# Patient Record
Sex: Male | Born: 1940 | Race: White | Hispanic: No | Smoking: Current every day smoker
Health system: Southern US, Community
[De-identification: ages and names within clinical notes are randomized; demographics above are authoritative.]

## PROBLEM LIST (undated history)

## (undated) ENCOUNTER — Emergency Department: Payer: Medicare Other

## (undated) DIAGNOSIS — I480 Paroxysmal atrial fibrillation: Secondary | ICD-10-CM

## (undated) DIAGNOSIS — I10 Essential (primary) hypertension: Secondary | ICD-10-CM

## (undated) DIAGNOSIS — F1010 Alcohol abuse, uncomplicated: Secondary | ICD-10-CM

## (undated) DIAGNOSIS — I340 Nonrheumatic mitral (valve) insufficiency: Secondary | ICD-10-CM

## (undated) DIAGNOSIS — I5022 Chronic systolic (congestive) heart failure: Secondary | ICD-10-CM

## (undated) DIAGNOSIS — E46 Unspecified protein-calorie malnutrition: Secondary | ICD-10-CM

## (undated) DIAGNOSIS — I350 Nonrheumatic aortic (valve) stenosis: Secondary | ICD-10-CM

## (undated) DIAGNOSIS — R0602 Shortness of breath: Secondary | ICD-10-CM

## (undated) DIAGNOSIS — N179 Acute kidney failure, unspecified: Secondary | ICD-10-CM

## (undated) HISTORY — DX: Alcohol abuse, uncomplicated: F10.10

## (undated) HISTORY — DX: Unspecified protein-calorie malnutrition: E46

## (undated) HISTORY — DX: Acute kidney failure, unspecified: N17.9

## (undated) HISTORY — DX: Paroxysmal atrial fibrillation: I48.0

## (undated) HISTORY — DX: Nonrheumatic aortic (valve) stenosis: I35.0

## (undated) HISTORY — DX: Shortness of breath: R06.02

---

## 2004-05-10 ENCOUNTER — Emergency Department: Payer: Self-pay | Admitting: Emergency Medicine

## 2004-05-20 ENCOUNTER — Emergency Department: Payer: Self-pay | Admitting: Emergency Medicine

## 2005-10-31 ENCOUNTER — Emergency Department: Payer: Self-pay | Admitting: Unknown Physician Specialty

## 2005-11-01 ENCOUNTER — Emergency Department: Payer: Self-pay | Admitting: Emergency Medicine

## 2007-05-13 ENCOUNTER — Emergency Department: Payer: Self-pay | Admitting: Emergency Medicine

## 2007-09-22 ENCOUNTER — Emergency Department: Payer: Self-pay | Admitting: Emergency Medicine

## 2008-02-12 ENCOUNTER — Emergency Department: Payer: Self-pay | Admitting: Emergency Medicine

## 2008-04-21 ENCOUNTER — Emergency Department: Payer: Self-pay | Admitting: Emergency Medicine

## 2009-08-20 ENCOUNTER — Emergency Department: Payer: Self-pay | Admitting: Internal Medicine

## 2010-03-28 ENCOUNTER — Emergency Department: Payer: Self-pay | Admitting: Emergency Medicine

## 2012-07-29 ENCOUNTER — Emergency Department: Payer: Self-pay | Admitting: Emergency Medicine

## 2012-08-10 ENCOUNTER — Ambulatory Visit: Payer: Self-pay | Admitting: Orthopedic Surgery

## 2012-08-10 LAB — HEMOGLOBIN: HGB: 13.1 g/dL (ref 13.0–18.0)

## 2012-08-12 ENCOUNTER — Ambulatory Visit: Payer: Self-pay | Admitting: Orthopedic Surgery

## 2014-06-23 NOTE — Op Note (Signed)
PATIENT NAME:  Austin Zamora, Austin Zamora DATE OF BIRTH:  09-23-1940  DATE OF PROCEDURE:  08/12/2012  PREOPERATIVE DIAGNOSIS: Left distal radius fracture.   POSTOPERATIVE DIAGNOSIS: Left distal radius fracture.   PROCEDURE: Left distal radius fracture open reduction and internal fixation.   SURGEON: Leitha SchullerMichael J. Mendel Binsfeld, M.D.   ANESTHESIA: General.   DESCRIPTION OF PROCEDURE: The patient was brought to the operating room and after adequate anesthesia was obtained, the left arm was prepped and draped in the usual sterile fashion. After patient identification and timeout procedures were completed, tourniquet was raised to 250 mmHg. Incision was made over the FCR tendon and the tendon sheath incised. The tendon was retracted radially to help protect the radial artery and the deep fascia incised. The pronator was elevated off the distal radius and the fracture site exposed. Distal first approach was taken as there was really very little correction obtained with traction and flexion at the fracture site under mini fluoroscopy. After applying a short, narrow, DVR plate, the distal pegs were filled in sequential fashion, drilling, measuring and placing self-tapping screws with 1 variable angle screw. Following this, the plate was brought down to the volar aspect of the distal radius and with encouragement, 3 cortical screws were placed, getting the plate to an anatomic position and correcting the distal radius. AP and lateral imaging showed good correction of the deformity. At this point, the wound was irrigated and the tourniquet let down. Hemostasis was achieved with electrocautery. The wound was closed with small absorbable suture, 3-0 Vicryl, 4-0 nylon for the skin, Xeroform, 4 x 4's, Webril and Ace wrap with a volar splint. The patient was sent to the recovery room in stable condition.   ESTIMATED BLOOD LOSS: Minimal.   COMPLICATIONS: None.   SPECIMEN: None.   IMPLANTS: [employer]  short, narrow, DVR plate, screws and pegs.   TOURNIQUET TIME: 30 minutes at 250 mmHg.    ____________________________ Leitha SchullerMichael J. Croix Presley, MD mjm:gb D: 08/12/2012 22:20:06 ET T: 08/12/2012 23:08:30 ET JOB#: [id number]  cc: Leitha SchullerMichael J. Hayes Czaja, MD, <Dictator> Leitha SchullerMICHAEL J Shery [place] MD ELECTRONICALLY SIGNED 08/13/2012 8:18

## 2014-09-05 ENCOUNTER — Encounter: Payer: Self-pay | Admitting: Intensive Care

## 2014-09-05 ENCOUNTER — Observation Stay
Admission: EM | Admit: 2014-09-05 | Discharge: 2014-09-06 | Disposition: A | Discharge status: Home / Self Care | Payer: Medicare HMO | Attending: Internal Medicine | Admitting: Internal Medicine

## 2014-09-05 DIAGNOSIS — R55 Syncope and collapse: Secondary | ICD-10-CM | POA: Insufficient documentation

## 2014-09-05 DIAGNOSIS — E871 Hypo-osmolality and hyponatremia: Secondary | ICD-10-CM | POA: Insufficient documentation

## 2014-09-05 DIAGNOSIS — F17210 Nicotine dependence, cigarettes, uncomplicated: Secondary | ICD-10-CM | POA: Insufficient documentation

## 2014-09-05 DIAGNOSIS — F10120 Alcohol abuse with intoxication, uncomplicated: Secondary | ICD-10-CM | POA: Diagnosis present

## 2014-09-05 DIAGNOSIS — I350 Nonrheumatic aortic (valve) stenosis: Secondary | ICD-10-CM | POA: Diagnosis present

## 2014-09-05 DIAGNOSIS — F10920 Alcohol use, unspecified with intoxication, uncomplicated: Secondary | ICD-10-CM

## 2014-09-05 DIAGNOSIS — F10129 Alcohol abuse with intoxication, unspecified: Principal | ICD-10-CM | POA: Insufficient documentation

## 2014-09-05 DIAGNOSIS — E46 Unspecified protein-calorie malnutrition: Secondary | ICD-10-CM | POA: Diagnosis not present

## 2014-09-05 LAB — CBC
HCT: 40.1 % (ref 40.0–52.0)
HEMOGLOBIN: 13.4 g/dL (ref 13.0–18.0)
MCH: 31.7 pg (ref 26.0–34.0)
MCHC: 33.5 g/dL (ref 32.0–36.0)
MCV: 94.6 fL (ref 80.0–100.0)
Platelets: 270 10*3/uL (ref 150–440)
RBC: 4.24 MIL/uL — AB (ref 4.40–5.90)
RDW: 14 % (ref 11.5–14.5)
WBC: 8.3 10*3/uL (ref 3.8–10.6)
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
## 2014-09-05 NOTE — ED Notes (Signed)
Pt has sister at bedside. Pt daughter also called. Family wants pt to be IVC for etoh abuse

## 2014-09-06 DIAGNOSIS — I350 Nonrheumatic aortic (valve) stenosis: Secondary | ICD-10-CM

## 2014-09-06 LAB — COMPREHENSIVE METABOLIC PANEL
ALBUMIN: 4.3 g/dL (ref 3.5–5.0)
ALT: 19 U/L (ref 17–63)
ANION GAP: 11 (ref 5–15)
AST: 33 U/L (ref 15–41)
Alkaline Phosphatase: 78 U/L (ref 38–126)
BUN: 16 mg/dL (ref 6–20)
CALCIUM: 8.5 mg/dL — AB (ref 8.9–10.3)
CO2: 24 mmol/L (ref 22–32)
CREATININE: 1.11 mg/dL (ref 0.61–1.24)
Chloride: 94 mmol/L — ABNORMAL LOW (ref 101–111)
GFR calc Af Amer: >60 mL/min (ref >60)
Glucose, Bld: 96 mg/dL (ref 65–99)
Potassium: 3.9 mmol/L (ref 3.5–5.1)
Sodium: 129 mmol/L — ABNORMAL LOW (ref 135–145)
Total Bilirubin: 0.6 mg/dL (ref 0.3–1.2)
Total Protein: 7 g/dL (ref 6.5–8.1)

## 2014-09-06 LAB — TROPONIN I: Troponin I: <0.03 ng/mL (ref <0.031)

## 2014-09-06 LAB — ETHANOL: ALCOHOL ETHYL (B): 314 mg/dL — AB (ref <5)

## 2014-09-06 LAB — HEMOGLOBIN A1C: Hgb A1c MFr Bld: 5.5 % (ref 4.0–6.0)

## 2014-09-06 LAB — SALICYLATE LEVEL: Salicylate Lvl: <4 mg/dL (ref 2.8–30.0)

## 2014-09-06 LAB — CK: Total CK: 253 U/L (ref 49–397)

## 2014-09-06 LAB — ACETAMINOPHEN LEVEL

## 2014-09-06 LAB — TSH: TSH: 0.699 u[IU]/mL (ref 0.350–4.500)

## 2014-09-06 MED ORDER — SODIUM CHLORIDE 0.9 % IV SOLN
INTRAVENOUS | Status: DC
  Administered 2014-09-06: 05:00:00 via INTRAVENOUS

## 2014-09-06 MED ORDER — ONDANSETRON HCL 4 MG PO TABS: 4.0000 mg | ORAL_TABLET | Freq: Four times a day (QID) | ORAL | Status: DC | PRN

## 2014-09-06 MED ORDER — LORAZEPAM 2 MG/ML IJ SOLN: 2.0000 mg | INTRAMUSCULAR | Status: DC | PRN

## 2014-09-06 MED ORDER — LORAZEPAM 2 MG/ML IJ SOLN
INTRAMUSCULAR | Status: AC
  Filled 2014-09-06: qty 1

## 2014-09-06 MED ORDER — HEPARIN SODIUM (PORCINE) 5000 UNIT/ML IJ SOLN
5000.0000 [IU] | Freq: Three times a day (TID) | INTRAMUSCULAR | Status: DC
  Administered 2014-09-06: 5000 [IU] via SUBCUTANEOUS
  Filled 2014-09-06: qty 1

## 2014-09-06 MED ORDER — ONDANSETRON HCL 4 MG/2ML IJ SOLN: 4.0000 mg | Freq: Four times a day (QID) | INTRAMUSCULAR | Status: DC | PRN

## 2014-09-06 MED ORDER — LORAZEPAM 2 MG/ML IJ SOLN
INTRAMUSCULAR | Status: AC
Start: 2014-09-06 — End: 2014-09-06
  Administered 2014-09-06: 05:00:00 via INTRAVENOUS
  Filled 2014-09-06: qty 1

## 2014-09-06 MED ORDER — SODIUM CHLORIDE 0.9 % IJ SOLN: 3.0000 mL | Freq: Two times a day (BID) | INTRAMUSCULAR | Status: DC

## 2014-09-06 MED ORDER — ACETAMINOPHEN 650 MG RE SUPP: 650.0000 mg | Freq: Four times a day (QID) | RECTAL | Status: DC | PRN

## 2014-09-06 MED ORDER — DOCUSATE SODIUM 100 MG PO CAPS
100.0000 mg | ORAL_CAPSULE | Freq: Two times a day (BID) | ORAL | Status: DC
  Administered 2014-09-06: 100 mg via ORAL
  Filled 2014-09-06: qty 1

## 2014-09-06 MED ORDER — ACETAMINOPHEN 325 MG PO TABS: 650.0000 mg | ORAL_TABLET | Freq: Four times a day (QID) | ORAL | Status: DC | PRN

## 2014-09-06 NOTE — Progress Notes (Signed)
[facility] [place] at [facility]                                                                                                                                                                                            Patient Demographics   Austin Zamora, is a 74 y.o. male, DOB - October 21, 1940, ZOX:[id number]  Admit date - 09/05/2014   Admitting Physician Arnaldo Natal, MD  Outpatient Primary MD for the patient is No primary care provider on file.   LOS -   Subjective: Patient admitted being found down, he was intoxicated. He is back to his normal baseline states that he drank a little bit more than he should've. He wants to go home     Review of Systems:   CONSTITUTIONAL: No documented fever. No fatigue, weakness. No weight gain, no weight loss.  EYES: No blurry or double vision.  ENT: No tinnitus. No postnasal drip. No redness of the oropharynx.  RESPIRATORY: No cough, no wheeze, no hemoptysis. No dyspnea.  CARDIOVASCULAR: No chest pain. No orthopnea. No palpitations. No syncope.  GASTROINTESTINAL: No nausea, no vomiting or diarrhea. No abdominal pain. No melena or hematochezia.  GENITOURINARY: No dysuria or hematuria.  ENDOCRINE: No polyuria or nocturia. No heat or cold intolerance.  HEMATOLOGY: No anemia. No bruising. No bleeding.  INTEGUMENTARY: No rashes. No lesions.  MUSCULOSKELETAL: No arthritis. No swelling. No gout.  NEUROLOGIC: No numbness, tingling, or ataxia. No seizure-type activity.  PSYCHIATRIC: No anxiety. No insomnia. No ADD.    Vitals:   Filed Vitals:   09/06/14 0300 09/06/14 0432 09/06/14 0434 09/06/14 0748  BP: 132/72 144/80  163/77  Pulse: 62 67  57  Temp:  98.2 F (36.8 C)  98.3 F (36.8 C)  TempSrc:  Oral  Oral  Resp:  18  17  Height:      Weight:   74.254 kg (163 lb 11.2 oz)   SpO2: 94% 99%  97%    Wt Readings from Last 3 Encounters:  09/06/14 74.254 kg (163 lb 11.2 oz)     Intake/Output Summary (Last 24 hours)  at 09/06/14 1122 Last data filed at 09/06/14 0915  Gross per 24 hour  Intake    780 ml  Output    400 ml  Net    380 ml    Physical Exam:   GENERAL: Pleasant-appearing in no apparent distress.  HEAD, EYES, EARS, NOSE AND THROAT: Atraumatic, normocephalic. Extraocular muscles are intact. Pupils equal and reactive to light. Sclerae anicteric. No conjunctival injection. No oro-pharyngeal erythema.  NECK: Supple. There is no jugular venous distention. No bruits,  no lymphadenopathy, no thyromegaly.  HEART: Regular rate and rhythm, tachycardic. No murmurs, no rubs, no clicks.  LUNGS: Clear to auscultation bilaterally. No rales or rhonchi. No wheezes.  ABDOMEN: Soft, flat, nontender, nondistended. Has good bowel sounds. No hepatosplenomegaly appreciated.  EXTREMITIES: No evidence of any cyanosis, clubbing, or peripheral edema.  +2 pedal and radial pulses bilaterally.  NEUROLOGIC: The patient is alert, awake, and oriented x3 with no focal motor or sensory deficits appreciated bilaterally.  SKIN: Moist and warm with no rashes appreciated.  Psych: Not anxious, depressed LN: No inguinal LN enlargement    Antibiotics   Anti-infectives    None      Medications   Scheduled Meds: . docusate sodium  100 mg Oral BID  . heparin  5,000 Units Subcutaneous 3 times per day  . sodium chloride  3 mL Intravenous Q12H   Continuous Infusions: . sodium chloride 100 mL/hr at 09/06/14 0500   PRN Meds:.acetaminophen **OR** acetaminophen, LORazepam, ondansetron **OR** ondansetron (ZOFRAN) IV   Data Review:   Micro Results No results found for this or any previous visit (from the past 240 hour(s)).  Radiology Reports No results found.   CBC  Recent Labs Lab 09/05/14 2307  WBC 8.3  HGB 13.4  HCT 40.1  PLT 270  MCV 94.6  MCH 31.7  MCHC 33.5  RDW 14.0    Chemistries   Recent Labs Lab 09/05/14 2307  NA 129*  K 3.9  CL 94*  CO2 24  GLUCOSE 96  BUN 16  CREATININE 1.11  CALCIUM  8.5*  AST 33  ALT 19  ALKPHOS 78  BILITOT 0.6   ------------------------------------------------------------------------------------------------------------------ estimated creatinine clearance is 63.2 mL/min (by C-G formula based on Cr of 1.11). ------------------------------------------------------------------------------------------------------------------ No results for input(s): HGBA1C in the last 72 hours. ------------------------------------------------------------------------------------------------------------------ No results for input(s): CHOL, HDL, LDLCALC, TRIG, CHOLHDL, LDLDIRECT in the last 72 hours. ------------------------------------------------------------------------------------------------------------------  Recent Labs  09/06/14 0050  TSH 0.699   ------------------------------------------------------------------------------------------------------------------ No results for input(s): VITAMINB12, FOLATE, FERRITIN, TIBC, IRON, RETICCTPCT in the last 72 hours.  Coagulation profile No results for input(s): INR, PROTIME in the last 168 hours.  No results for input(s): DDIMER in the last 72 hours.  Cardiac Enzymes  Recent Labs Lab 09/06/14 0050  TROPONINI <0.03   ------------------------------------------------------------------------------------------------------------------ Invalid input(s): POCBNP    Assessment & Plan  This is a 74 year old Caucasian male admitted for alcohol intoxication and aortic stenosis. 1. Alcohol intoxication: Now resolved patient recommended not to drink too much alcohol. He is not interested in any detox 2. Aortic stenosis: Severity is unclear.  3. Malnutrition: The patient is mildly underweight likely due to caloric intake mostly alcohol recommended that he intake appropriate calories 4. Hyponatremia: Secondary to chronic alcohol abuse. Again patient is to stop drinking this will cause it to sodium to improve 5. DVT  prophylaxis: Heparin 6. GI prophylaxis: None The patient is a full code. Time spent on admission orders and patient care approximately 35 minutes          Code Status Orders        Start     Ordered   09/06/14 0430  Full code   Continuous     09/06/14 0429           Consults  none   DVT Prophylaxis ambulatory  Lab Results  Component Value Date   PLT 270 09/05/2014     Time Spent in minutes  45 minutes   Greater than 50% of time spent in  care coordination and counseling.   Auburn Bilberry M.D on 09/06/2014 at 11:22 AM  Between 7am to 6pm - Pager - [phone]  After 6pm go to [email or web address] - password [email or web address] [facility]  [facility]   Office  [phone]

## 2014-09-06 NOTE — ED Notes (Signed)
Patient transported to X-ray 

## 2014-09-06 NOTE — ED Notes (Signed)
Patient denies pain and is resting comfortably.  

## 2014-09-06 NOTE — Discharge Summary (Signed)
Austin Zamora, 74 y.o., DOB [birth year], MRN [id number]. Admission date: 09/05/2014 Discharge Date 09/06/2014 Primary MD No primary care provider on file. 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 Patient was seen in the ER and was noted to be intoxicated and was admitted for further evaluation and IV fluids. Patient this morning is doing well and wants to go home. Denies any chest pain or shortness of breath           Consults  None  Significant Tests:  See full reports for all details    No results found.     Today   Subjective:   Austin Zamora  feels well and is awake once to go home  Objective:   Blood pressure 163/77, pulse 57, temperature 98.3 F (36.8 C), temperature source Oral, resp. rate 17, height  (1.854 m), weight 74.254 kg (163 lb 11.2 oz), SpO2 97 %.  .  Intake/Output Summary (Last 24 hours) at 09/06/14 1146 Last data filed at 09/06/14 0915  Gross per 24 hour  Intake    780 ml  Output    400 ml  Net    380 ml    Exam VITAL SIGNS: Blood pressure 163/77, pulse 57, temperature 98.3 F (36.8 C), temperature source Oral, resp. rate 17, height  (1.854 m), weight 74.254 kg (163 lb 11.2 oz), SpO2 97 %.  GENERAL:  74 y.o.-year-old patient lying in the bed with no acute distress.  EYES: Pupils equal, round, reactive to light and accommodation. No scleral icterus. Extraocular muscles intact.  HEENT: Head atraumatic, normocephalic. Oropharynx and nasopharynx clear.  NECK:  Supple, no jugular venous distention. No  thyroid enlargement, no tenderness.  LUNGS: Normal breath sounds bilaterally, no wheezing, rales,rhonchi or crepitation. No use of accessory muscles of respiration.  CARDIOVASCULAR: S1, S2 normal. No murmurs, rubs, or gallops.  ABDOMEN: Soft, nontender, nondistended. Bowel sounds present. No organomegaly or mass.  EXTREMITIES: No pedal edema, cyanosis, or clubbing.  NEUROLOGIC: Cranial nerves II through XII are intact. Muscle strength 5/5 in all extremities. Sensation intact. Gait not checked.  PSYCHIATRIC: The patient is alert and oriented x 3.  SKIN: No obvious rash, lesion, or ulcer.   Data Review     CBC w Diff: Lab Results  Component Value Date   WBC 8.3 09/05/2014   HGB 13.4 09/05/2014   HGB 13.1 08/10/2012   HCT 40.1 09/05/2014   PLT 270 09/05/2014   CMP: Lab Results  Component Value Date   NA 129* 09/05/2014   K 3.9 09/05/2014   CL 94* 09/05/2014   CO2 24 09/05/2014   BUN 16 09/05/2014   CREATININE 1.11 09/05/2014   PROT 7.0 09/05/2014   ALBUMIN 4.3 09/05/2014   BILITOT 0.6 09/05/2014   ALKPHOS 78 09/05/2014   AST 33 09/05/2014   ALT 19 09/05/2014  .  Micro Results No results found for this or any previous visit (from the past 240 hour(s)).      Code Status Orders        Start     Ordered   09/06/14 0430  Full code   Continuous     09/06/14 0429          Follow-up Information    Follow up with Dr. Lorra HalsKelsay Walch On 10/03/2015.   Why:  Please bring proof of insurance and residence appointment with Dr. Robyne AskewWalch on 10/03/14 at 1:20pm.   Contact information:   [facility]                   [address]              [place] [id number]      Discharge Medications Patient not on any medications no medications prescribed    Medication List    Notice    You have not been prescribed any medications.         Total Time in preparing paper work, data evaluation and todays exam - 35 minutes  Auburn BilberryPATEL, Kairen Hallinan M.D on  09/06/2014 at 11:46 AM  [facility]   Office  [phone]

## 2014-09-06 NOTE — Discharge Instructions (Signed)
°  DIET:  Cardiac diet  DISCHARGE CONDITION:  Good  ACTIVITY:  Activity as tolerated  OXYGEN:  Home Oxygen: No.   Oxygen Delivery: room air  DISCHARGE LOCATION:  home    ADDITIONAL DISCHARGE INSTRUCTION: decrease amount of alcohol intake if you are gonna drink, resume all meds as taking before   If you experience worsening of your admission symptoms, develop shortness of breath, life threatening emergency, suicidal or homicidal thoughts you must seek medical attention immediately by calling 911 or calling your MD immediately  if symptoms less severe.  You Must read complete instructions/literature along with all the possible adverse reactions/side effects for all the Medicines you take and that have been prescribed to you. Take any new Medicines after you have completely understood and accpet all the possible adverse reactions/side effects.   Please note  You were cared for by a hospitalist during your hospital stay. If you have any questions about your discharge medications or the care you received while you were in the hospital after you are discharged, you can call the unit and asked to speak with the hospitalist on call if the hospitalist that took care of you is not available. Once you are discharged, your primary care physician will handle any further medical issues. Please note that NO REFILLS for any discharge medications will be authorized once you are discharged, as it is imperative that you return to your primary care physician (or establish a relationship with a primary care physician if you do not have one) for your aftercare needs so that they can reassess your need for medications and monitor your lab values.

## 2014-09-06 NOTE — H&P (Signed)
Austin Zamora is an 74 y.o. male.    Chief Complaint: Found down HPI: The patient presents to the emergency department via EMS after being found unconscious in his garage of loss of bowel and bladder continence. When he was found he required no resuscitation and would arouse to strong verbal stimuli. He was clearly intoxicated. In the emergency department he was found to have an elevated blood alcohol level as well as a significant murmur concerning for aortic stenosis. Due to the vast etiology and concerning potential for his loss of consciousness the emergency department called for admission. The patient's family was originally at the bedside however they were gone by the time of my exam  History reviewed. No pertinent past medical history.  History reviewed. No pertinent past surgical history.  History reviewed. No pertinent family history. Social History:  reports that he has been smoking Cigarettes.  He has been smoking about 1.00 pack per day. He has never used smokeless tobacco. He reports that he drinks alcohol. His drug history is not on file.  Allergies: No Known Allergies  No prescriptions prior to admission    Results for orders placed or performed during the hospital encounter of 09/05/14 (from the past 48 hour(s))  CBC     Status: Abnormal   Collection Time: 09/05/14 11:07 PM  Result Value Ref Range   WBC 8.3 3.8 - 10.6 K/uL   RBC 4.24 (L) 4.40 - 5.90 MIL/uL   Hemoglobin 13.4 13.0 - 18.0 g/dL   HCT 40.1 40.0 - 52.0 %   MCV 94.6 80.0 - 100.0 fL   MCH 31.7 26.0 - 34.0 pg   MCHC 33.5 32.0 - 36.0 g/dL   RDW 14.0 11.5 - 14.5 %   Platelets 270 150 - 440 K/uL  Comprehensive metabolic panel     Status: Abnormal   Collection Time: 09/05/14 11:07 PM  Result Value Ref Range   Sodium 129 (L) 135 - 145 mmol/L   Potassium 3.9 3.5 - 5.1 mmol/L   Chloride 94 (L) 101 - 111 mmol/L   CO2 24 22 - 32 mmol/L   Glucose, Bld 96 65 - 99 mg/dL   BUN 16 6 - 20 mg/dL   Creatinine, Ser 1.11  0.61 - 1.24 mg/dL   Calcium 8.5 (L) 8.9 - 10.3 mg/dL   Total Protein 7.0 6.5 - 8.1 g/dL   Albumin 4.3 3.5 - 5.0 g/dL   AST 33 15 - 41 U/L   ALT 19 17 - 63 U/L   Alkaline Phosphatase 78 38 - 126 U/L   Total Bilirubin 0.6 0.3 - 1.2 mg/dL   GFR calc non Af Amer >60 >60 mL/min   GFR calc Af Amer >60 >60 mL/min    Comment: (NOTE) The eGFR has been calculated using the CKD EPI equation. This calculation has not been validated in all clinical situations. eGFR's persistently <60 mL/min signify possible Chronic Kidney Disease.    Anion gap 11 5 - 15  Ethanol (ETOH)     Status: Abnormal   Collection Time: 09/05/14 11:07 PM  Result Value Ref Range   Alcohol, Ethyl (B) 314 (HH) <5 mg/dL    Comment: CRITICAL RESULT CALLED TO, READ BACK BY AND VERIFIED WITH KIMREY BROWN @ 0008 7.6.16 MPG        LOWEST DETECTABLE LIMIT FOR SERUM ALCOHOL IS 5 mg/dL FOR MEDICAL PURPOSES ONLY   Acetaminophen level     Status: Abnormal   Collection Time: 09/05/14 11:07 PM  Result Value  Ref Range   Acetaminophen (Tylenol), Serum <10 (L) 10 - 30 ug/mL    Comment:        THERAPEUTIC CONCENTRATIONS VARY SIGNIFICANTLY. A RANGE OF 10-30 ug/mL MAY BE AN EFFECTIVE CONCENTRATION FOR MANY PATIENTS. HOWEVER, SOME ARE BEST TREATED AT CONCENTRATIONS OUTSIDE THIS RANGE. ACETAMINOPHEN CONCENTRATIONS >150 ug/mL AT 4 HOURS AFTER INGESTION AND >50 ug/mL AT 12 HOURS AFTER INGESTION ARE OFTEN ASSOCIATED WITH TOXIC REACTIONS.   Salicylate level     Status: None   Collection Time: 09/05/14 11:07 PM  Result Value Ref Range   Salicylate Lvl <8.7 2.8 - 30.0 mg/dL  CK     Status: None   Collection Time: 09/05/14 11:07 PM  Result Value Ref Range   Total CK 253 49 - 397 U/L  Troponin I     Status: None   Collection Time: 09/06/14 12:50 AM  Result Value Ref Range   Troponin I <0.03 <0.031 ng/mL    Comment:        NO INDICATION OF MYOCARDIAL INJURY.    No results found.  Review of Systems  Unable to perform ROS:  patient intoxicated  Cardiovascular: Positive for chest pain.   the patient arouses long enough to coming that he is in no pain. Otherwise he does not persistently respond appropriately to questions  Blood pressure 144/80, pulse 67, temperature 98.2 F (36.8 C), temperature source Oral, resp. rate 18, height $RemoveBe'6\' 1"'iJnhMuZfw$  (1.854 m), weight 74.254 kg (163 lb 11.2 oz), SpO2 99 %. Physical Exam  Constitutional: He appears well-developed and well-nourished. He appears lethargic. No distress.  HENT:  Head: Normocephalic and atraumatic.  Mouth/Throat: Oropharynx is clear and moist.  Eyes: Conjunctivae and EOM are normal. Pupils are equal, round, and reactive to light. No scleral icterus.  Neck: Normal range of motion. Neck supple. No JVD present. No tracheal deviation present. No thyromegaly present.  Cardiovascular: Normal rate, regular rhythm and intact distal pulses.  Exam reveals no gallop and no friction rub.   Murmur heard.  Systolic murmur is present with a grade of 3/6  Respiratory: Effort normal and breath sounds normal. No respiratory distress.  GI: Soft. Bowel sounds are normal. He exhibits no distension. There is no tenderness.  Genitourinary:  Deferred  Musculoskeletal: He exhibits no edema.  Patient is uncooperative  Lymphadenopathy:    He has no cervical adenopathy.  Neurological: He appears lethargic.  Patient is uncooperative with neurological exam  Skin: Skin is warm and dry. No rash noted. No erythema.  Psychiatric:  Unable to assess as the patient is not a reliable historian nor focused on interviewer      Assessment/Plan This is a 74 year old Caucasian male admitted for alcohol intoxication and aortic stenosis. 1. Alcohol intoxication: Aggressively hydrate the patient. We have placed him on CIWA scale to screen for withdrawal.  2. Aortic stenosis: Severity is unclear. The patient is maintained his pressures throughout this ordeal. Cardiology consultation ordered for echo  evaluation. 3. Malnutrition: The patient is mildly underweight likely due to caloric intake mostly alcohol  4. Hyponatremia: Secondary to chronic alcohol abuse. Rehydrate the patient gradually monitor sodium levels 5. DVT prophylaxis: Heparin 6. GI prophylaxis: None The patient is a full code. Time spent on admission orders and patient care approximately 35 minutes  Harrie Foreman 09/06/2014, 7:10 AM

## 2014-09-06 NOTE — Progress Notes (Signed)
Pt d/c home; d/c instructions reviewed w/ pt; pt understanding was verbalized; IV removed catheter in tact, gauze dressing applied; all pt questions answered; pt left unit via wheelchair accompanied by staff 

## 2014-10-16 DIAGNOSIS — F1010 Alcohol abuse, uncomplicated: Secondary | ICD-10-CM | POA: Insufficient documentation

## 2017-04-06 DIAGNOSIS — R0602 Shortness of breath: Secondary | ICD-10-CM | POA: Insufficient documentation

## 2017-04-06 DIAGNOSIS — I480 Paroxysmal atrial fibrillation: Secondary | ICD-10-CM | POA: Insufficient documentation

## 2017-05-17 ENCOUNTER — Inpatient Hospital Stay
Admission: EM | Admit: 2017-05-17 | Discharge: 2017-05-18 | DRG: 193 | Disposition: A | Discharge status: Home Health | Payer: Medicare Other | Attending: Internal Medicine | Admitting: Internal Medicine

## 2017-05-17 ENCOUNTER — Inpatient Hospital Stay: Payer: Medicare Other

## 2017-05-17 ENCOUNTER — Inpatient Hospital Stay
Admit: 2017-05-17 | Discharge: 2017-05-17 | Disposition: A | Discharge status: Home / Self Care | Payer: Medicare Other | Attending: Family Medicine | Admitting: Family Medicine

## 2017-05-17 ENCOUNTER — Emergency Department: Payer: Medicare Other

## 2017-05-17 ENCOUNTER — Encounter: Payer: Self-pay | Admitting: Emergency Medicine

## 2017-05-17 ENCOUNTER — Other Ambulatory Visit: Payer: Self-pay

## 2017-05-17 DIAGNOSIS — N179 Acute kidney failure, unspecified: Secondary | ICD-10-CM | POA: Diagnosis not present

## 2017-05-17 DIAGNOSIS — Z7901 Long term (current) use of anticoagulants: Secondary | ICD-10-CM

## 2017-05-17 DIAGNOSIS — F1010 Alcohol abuse, uncomplicated: Secondary | ICD-10-CM | POA: Diagnosis present

## 2017-05-17 DIAGNOSIS — I712 Thoracic aortic aneurysm, without rupture: Secondary | ICD-10-CM | POA: Diagnosis not present

## 2017-05-17 DIAGNOSIS — I5020 Unspecified systolic (congestive) heart failure: Secondary | ICD-10-CM | POA: Diagnosis present

## 2017-05-17 DIAGNOSIS — E43 Unspecified severe protein-calorie malnutrition: Secondary | ICD-10-CM

## 2017-05-17 DIAGNOSIS — I080 Rheumatic disorders of both mitral and aortic valves: Secondary | ICD-10-CM | POA: Diagnosis not present

## 2017-05-17 DIAGNOSIS — E872 Acidosis, unspecified: Secondary | ICD-10-CM

## 2017-05-17 DIAGNOSIS — Z9889 Other specified postprocedural states: Secondary | ICD-10-CM

## 2017-05-17 DIAGNOSIS — F17210 Nicotine dependence, cigarettes, uncomplicated: Secondary | ICD-10-CM | POA: Diagnosis not present

## 2017-05-17 DIAGNOSIS — Z6821 Body mass index (BMI) 21.0-21.9, adult: Secondary | ICD-10-CM | POA: Diagnosis not present

## 2017-05-17 DIAGNOSIS — Z8249 Family history of ischemic heart disease and other diseases of the circulatory system: Secondary | ICD-10-CM

## 2017-05-17 DIAGNOSIS — R64 Cachexia: Secondary | ICD-10-CM | POA: Diagnosis present

## 2017-05-17 DIAGNOSIS — J449 Chronic obstructive pulmonary disease, unspecified: Secondary | ICD-10-CM | POA: Diagnosis present

## 2017-05-17 DIAGNOSIS — I110 Hypertensive heart disease with heart failure: Secondary | ICD-10-CM | POA: Diagnosis present

## 2017-05-17 DIAGNOSIS — J101 Influenza due to other identified influenza virus with other respiratory manifestations: Principal | ICD-10-CM | POA: Diagnosis present

## 2017-05-17 DIAGNOSIS — I248 Other forms of acute ischemic heart disease: Secondary | ICD-10-CM | POA: Diagnosis not present

## 2017-05-17 DIAGNOSIS — E860 Dehydration: Secondary | ICD-10-CM

## 2017-05-17 DIAGNOSIS — R531 Weakness: Secondary | ICD-10-CM | POA: Diagnosis present

## 2017-05-17 DIAGNOSIS — I480 Paroxysmal atrial fibrillation: Secondary | ICD-10-CM | POA: Diagnosis not present

## 2017-05-17 DIAGNOSIS — Z79899 Other long term (current) drug therapy: Secondary | ICD-10-CM

## 2017-05-17 DIAGNOSIS — J9601 Acute respiratory failure with hypoxia: Secondary | ICD-10-CM | POA: Diagnosis present

## 2017-05-17 DIAGNOSIS — I350 Nonrheumatic aortic (valve) stenosis: Secondary | ICD-10-CM

## 2017-05-17 DIAGNOSIS — R0602 Shortness of breath: Secondary | ICD-10-CM

## 2017-05-17 HISTORY — DX: Unspecified severe protein-calorie malnutrition: E43

## 2017-05-17 LAB — LIPID PANEL
CHOLESTEROL: 104 mg/dL (ref 0–200)
HDL: 28 mg/dL — AB (ref >40)
LDL CALC: 60 mg/dL (ref 0–99)
Total CHOL/HDL Ratio: 3.7 RATIO
Triglycerides: 79 mg/dL (ref <150)
VLDL: 16 mg/dL (ref 0–40)

## 2017-05-17 LAB — COMPREHENSIVE METABOLIC PANEL
ALK PHOS: 98 U/L (ref 38–126)
ALT: 29 U/L (ref 17–63)
AST: 47 U/L — ABNORMAL HIGH (ref 15–41)
Albumin: 3.2 g/dL — ABNORMAL LOW (ref 3.5–5.0)
Anion gap: 11 (ref 5–15)
BILIRUBIN TOTAL: 1.8 mg/dL — AB (ref 0.3–1.2)
BUN: 45 mg/dL — ABNORMAL HIGH (ref 6–20)
CO2: 23 mmol/L (ref 22–32)
Calcium: 9.7 mg/dL (ref 8.9–10.3)
Chloride: 100 mmol/L — ABNORMAL LOW (ref 101–111)
Creatinine, Ser: 1.94 mg/dL — ABNORMAL HIGH (ref 0.61–1.24)
GFR calc Af Amer: 37 mL/min — ABNORMAL LOW (ref >60)
GFR calc non Af Amer: 32 mL/min — ABNORMAL LOW (ref >60)
Glucose, Bld: 97 mg/dL (ref 65–99)
Potassium: 4.1 mmol/L (ref 3.5–5.1)
Sodium: 134 mmol/L — ABNORMAL LOW (ref 135–145)
Total Protein: 7 g/dL (ref 6.5–8.1)

## 2017-05-17 LAB — URINALYSIS, COMPLETE (UACMP) WITH MICROSCOPIC
BACTERIA UA: NONE SEEN
BILIRUBIN URINE: NEGATIVE
Glucose, UA: NEGATIVE mg/dL
HGB URINE DIPSTICK: NEGATIVE
KETONES UR: NEGATIVE mg/dL
LEUKOCYTES UA: NEGATIVE
Nitrite: NEGATIVE
PROTEIN: NEGATIVE mg/dL
SPECIFIC GRAVITY, URINE: 1.013 (ref 1.005–1.030)
SQUAMOUS EPITHELIAL / LPF: NONE SEEN
pH: 5 (ref 5.0–8.0)

## 2017-05-17 LAB — CBC WITH DIFFERENTIAL/PLATELET
Basophils Absolute: 0.1 10*3/uL (ref 0–0.1)
Basophils Relative: 1 %
Eosinophils Absolute: 0.1 10*3/uL (ref 0–0.7)
Eosinophils Relative: 1 %
HCT: 52.3 % — ABNORMAL HIGH (ref 40.0–52.0)
HEMOGLOBIN: 16.7 g/dL (ref 13.0–18.0)
LYMPHS PCT: 24 %
Lymphs Abs: 1.7 10*3/uL (ref 1.0–3.6)
MCH: 27.2 pg (ref 26.0–34.0)
MCHC: 31.9 g/dL — AB (ref 32.0–36.0)
MCV: 85 fL (ref 80.0–100.0)
MONOS PCT: 12 %
Monocytes Absolute: 0.8 10*3/uL (ref 0.2–1.0)
NEUTROS ABS: 4.4 10*3/uL (ref 1.4–6.5)
NEUTROS PCT: 62 %
Platelets: 285 10*3/uL (ref 150–440)
RBC: 6.15 MIL/uL — ABNORMAL HIGH (ref 4.40–5.90)
RDW: 22.2 % — AB (ref 11.5–14.5)
WBC: 7 10*3/uL (ref 3.8–10.6)

## 2017-05-17 LAB — ECHOCARDIOGRAM COMPLETE
Height: 68 in
Weight: 2308.8 oz

## 2017-05-17 LAB — TROPONIN I
Troponin I: 0.07 ng/mL (ref <0.03)
Troponin I: 0.06 ng/mL (ref <0.03)
Troponin I: 0.04 ng/mL (ref <0.03)
Troponin I: 0.04 ng/mL (ref <0.03)

## 2017-05-17 LAB — INFLUENZA PANEL BY PCR (TYPE A & B)
INFLAPCR: POSITIVE — AB
Influenza B By PCR: NEGATIVE

## 2017-05-17 LAB — TSH: TSH: 1.663 u[IU]/mL (ref 0.350–4.500)

## 2017-05-17 LAB — LACTIC ACID, PLASMA
Lactic Acid, Venous: 2.1 mmol/L (ref 0.5–1.9)
Lactic Acid, Venous: 2 mmol/L (ref 0.5–1.9)

## 2017-05-17 LAB — PREALBUMIN: Prealbumin: 8.8 mg/dL — ABNORMAL LOW (ref 18–38)

## 2017-05-17 LAB — LIPASE, BLOOD: Lipase: 48 U/L (ref 11–51)

## 2017-05-17 LAB — BRAIN NATRIURETIC PEPTIDE

## 2017-05-17 MED ORDER — BUDESONIDE 0.5 MG/2ML IN SUSP
0.5000 mg | Freq: Two times a day (BID) | RESPIRATORY_TRACT | Status: DC
  Administered 2017-05-17 – 2017-05-18 (×3): 0.5 mg via RESPIRATORY_TRACT
  Filled 2017-05-17 (×3): qty 2

## 2017-05-17 MED ORDER — ENSURE ENLIVE PO LIQD: 237.0000 mL | Freq: Three times a day (TID) | ORAL | Status: DC

## 2017-05-17 MED ORDER — NITROGLYCERIN 0.4 MG SL SUBL: 0.4000 mg | SUBLINGUAL_TABLET | SUBLINGUAL | Status: DC | PRN

## 2017-05-17 MED ORDER — HEPARIN SODIUM (PORCINE) 5000 UNIT/ML IJ SOLN
5000.0000 [IU] | Freq: Three times a day (TID) | INTRAMUSCULAR | Status: DC
  Administered 2017-05-17: 5000 [IU] via SUBCUTANEOUS
  Filled 2017-05-17: qty 1

## 2017-05-17 MED ORDER — OSELTAMIVIR PHOSPHATE 75 MG PO CAPS
75.0000 mg | ORAL_CAPSULE | Freq: Once | ORAL | Status: AC
  Administered 2017-05-17: 75 mg via ORAL
  Filled 2017-05-17: qty 1

## 2017-05-17 MED ORDER — OSELTAMIVIR PHOSPHATE 30 MG PO CAPS
30.0000 mg | ORAL_CAPSULE | Freq: Two times a day (BID) | ORAL | Status: DC
  Administered 2017-05-17 – 2017-05-18 (×3): 30 mg via ORAL
  Filled 2017-05-17 (×4): qty 1

## 2017-05-17 MED ORDER — ACETAMINOPHEN 650 MG RE SUPP
650.0000 mg | Freq: Four times a day (QID) | RECTAL | Status: DC | PRN
Start: 2017-05-17 — End: 2017-05-18

## 2017-05-17 MED ORDER — ACETAMINOPHEN 325 MG PO TABS: 650.0000 mg | ORAL_TABLET | Freq: Four times a day (QID) | ORAL | Status: DC | PRN

## 2017-05-17 MED ORDER — LORAZEPAM 2 MG/ML IJ SOLN: 1.0000 mg | Freq: Four times a day (QID) | INTRAMUSCULAR | Status: DC | PRN

## 2017-05-17 MED ORDER — ONDANSETRON HCL 4 MG/2ML IJ SOLN: 4.0000 mg | Freq: Four times a day (QID) | INTRAMUSCULAR | Status: DC | PRN

## 2017-05-17 MED ORDER — ONDANSETRON HCL 4 MG PO TABS: 4.0000 mg | ORAL_TABLET | Freq: Four times a day (QID) | ORAL | Status: DC | PRN

## 2017-05-17 MED ORDER — FOLIC ACID 1 MG PO TABS
1.0000 mg | ORAL_TABLET | Freq: Every day | ORAL | Status: DC
  Administered 2017-05-17 – 2017-05-18 (×2): 1 mg via ORAL
  Filled 2017-05-17 (×2): qty 1

## 2017-05-17 MED ORDER — ALUM & MAG HYDROXIDE-SIMETH 200-200-20 MG/5ML PO SUSP
30.0000 mL | ORAL | Status: DC | PRN
  Administered 2017-05-17: 30 mL via ORAL
  Filled 2017-05-17: qty 30

## 2017-05-17 MED ORDER — MORPHINE SULFATE (PF) 2 MG/ML IV SOLN: 2.0000 mg | INTRAVENOUS | Status: DC | PRN

## 2017-05-17 MED ORDER — SODIUM CHLORIDE 0.9 % IV BOLUS (SEPSIS)
500.0000 mL | Freq: Once | INTRAVENOUS | Status: AC
  Administered 2017-05-17: 500 mL via INTRAVENOUS

## 2017-05-17 MED ORDER — VITAMIN B-1 100 MG PO TABS
100.0000 mg | ORAL_TABLET | Freq: Every day | ORAL | Status: DC
  Administered 2017-05-17 – 2017-05-18 (×2): 100 mg via ORAL
  Filled 2017-05-17 (×2): qty 1

## 2017-05-17 MED ORDER — IPRATROPIUM-ALBUTEROL 0.5-2.5 (3) MG/3ML IN SOLN: 3.0000 mL | RESPIRATORY_TRACT | Status: DC | PRN

## 2017-05-17 MED ORDER — SODIUM CHLORIDE 0.9 % IV SOLN
INTRAVENOUS | Status: DC
  Administered 2017-05-17: 07:00:00 via INTRAVENOUS

## 2017-05-17 MED ORDER — APIXABAN 5 MG PO TABS: 5.0000 mg | ORAL_TABLET | Freq: Two times a day (BID) | ORAL | Status: DC

## 2017-05-17 MED ORDER — HYDROCODONE-ACETAMINOPHEN 5-325 MG PO TABS
1.0000 | ORAL_TABLET | ORAL | Status: DC | PRN
  Administered 2017-05-17 (×2): 2 via ORAL
  Filled 2017-05-17 (×2): qty 2

## 2017-05-17 MED ORDER — LISINOPRIL 20 MG PO TABS: 20.0000 mg | ORAL_TABLET | Freq: Every day | ORAL | Status: DC

## 2017-05-17 MED ORDER — ADULT MULTIVITAMIN W/MINERALS CH
1.0000 | ORAL_TABLET | Freq: Every day | ORAL | Status: DC
  Administered 2017-05-17 – 2017-05-18 (×2): 1 via ORAL
  Filled 2017-05-17 (×2): qty 1

## 2017-05-17 MED ORDER — NICOTINE 14 MG/24HR TD PT24
14.0000 mg | MEDICATED_PATCH | Freq: Every day | TRANSDERMAL | Status: DC
  Administered 2017-05-17 – 2017-05-18 (×2): 14 mg via TRANSDERMAL
  Filled 2017-05-17 (×2): qty 1

## 2017-05-17 MED ORDER — CARVEDILOL 6.25 MG PO TABS: 3.1250 mg | ORAL_TABLET | Freq: Two times a day (BID) | ORAL | Status: DC

## 2017-05-17 MED ORDER — GUAIFENESIN ER 600 MG PO TB12
600.0000 mg | ORAL_TABLET | Freq: Two times a day (BID) | ORAL | Status: DC
  Administered 2017-05-17 – 2017-05-18 (×2): 600 mg via ORAL
  Filled 2017-05-17 (×3): qty 1

## 2017-05-17 MED ORDER — ASPIRIN EC 325 MG PO TBEC
325.0000 mg | DELAYED_RELEASE_TABLET | Freq: Every day | ORAL | Status: DC
  Administered 2017-05-17 – 2017-05-18 (×2): 325 mg via ORAL
  Filled 2017-05-17 (×2): qty 1

## 2017-05-17 MED ORDER — APIXABAN 5 MG PO TABS
5.0000 mg | ORAL_TABLET | Freq: Two times a day (BID) | ORAL | Status: DC
  Administered 2017-05-17 – 2017-05-18 (×2): 5 mg via ORAL
  Filled 2017-05-17 (×2): qty 1

## 2017-05-17 MED ORDER — THIAMINE HCL 100 MG/ML IJ SOLN: 100.0000 mg | Freq: Every day | INTRAMUSCULAR | Status: DC

## 2017-05-17 MED ORDER — METOPROLOL SUCCINATE ER 50 MG PO TB24
50.0000 mg | ORAL_TABLET | Freq: Every day | ORAL | Status: DC
  Administered 2017-05-17: 50 mg via ORAL
  Filled 2017-05-17 (×2): qty 1

## 2017-05-17 MED ORDER — LORAZEPAM 1 MG PO TABS: 1.0000 mg | ORAL_TABLET | Freq: Four times a day (QID) | ORAL | Status: DC | PRN

## 2017-05-17 NOTE — ED Provider Notes (Signed)
[facility] Emergency Department Provider Note  ____________________________________________   First MD Initiated Contact with Patient 05/17/17 [phone]     (approximate)  I have reviewed the triage vital signs and the nursing notes.   HISTORY  Chief Complaint Generalized Body Aches  History is challenging to obtain as the patient is a very poor historian  HPI Austin Zamora is a 77 y.o. male is brought to the emergency department by family with generalized malaise slowly progressive for the past 2 weeks or so.  Family is concerned because they have had other family members with influenza and they feel the patient may have it as well.  He has had decreased exercise tolerance recently and has exertional chest pain and shortness of breath.  He is been having intermittent loose stools for the past several days.  His symptoms are currently moderate severity.  They are worse with exertion and somewhat improved with rest.  He has a complex past medical history including heart failure as well as alcohol abuse.  History reviewed. No pertinent past medical history.  Patient Active Problem List   Diagnosis Date Noted  . Influenza A 05/17/2017  . Protein-calorie malnutrition, severe 05/17/2017  . Aortic stenosis 09/06/2014    History reviewed. No pertinent surgical history.  Prior to Admission medications   Medication Sig Start Date End Date Taking? Authorizing Provider  ELIQUIS 5 MG TABS tablet Take 5 mg by mouth 2 (two) times daily.  04/06/17  Yes [provider]  furosemide (LASIX) 20 MG tablet Take 20 mg by mouth daily.  04/06/17  Yes [provider]  lisinopril (PRINIVIL,ZESTRIL) 20 MG tablet Take 20 mg by mouth daily.  05/15/17  Yes [provider]  metoprolol succinate (TOPROL-XL) 50 MG 24 hr tablet Take 50 mg by mouth daily.  04/06/17  Yes [provider]  PROAIR HFA 108 [phone] Base) MCG/ACT inhaler  05/15/17  Yes [provider]  feeding supplement, ENSURE ENLIVE, (ENSURE ENLIVE) LIQD Take 237 mLs by mouth 3 (three) times daily between meals. 05/18/17   Adrian Saran, MD  nicotine (NICODERM CQ - DOSED IN MG/24 HOURS) 14 mg/24hr patch Place 1 patch (14 mg total) onto the skin daily. 05/18/17   Adrian Saran, MD  nitroGLYCERIN (NITROSTAT) 0.4 MG SL tablet Place 1 tablet (0.4 mg total) under the tongue every 5 (five) minutes as needed for chest pain. 05/18/17   Adrian Saran, MD  oseltamivir (TAMIFLU) 30 MG capsule Take 1 capsule (30 mg total) by mouth 2 (two) times daily for 3 days. 05/18/17 05/21/17  Adrian Saran, MD    Allergies Patient has no known allergies.  No family history on file.  Social History Social History   Tobacco Use  . Smoking status: Current Every Day Smoker    Packs/day: 1.00    Types: Cigarettes  . Smokeless tobacco: Never Used  Substance Use Topics  . Alcohol use: Yes  . Drug use: No    Review of Systems Constitutional: No fever/chills Eyes: No visual changes. ENT: No sore throat. Cardiovascular: Positive for chest pain. Respiratory: Positive for shortness of breath. Gastrointestinal: No abdominal pain.  Positive for nausea, no vomiting.  Positive for diarrhea.  No constipation. Genitourinary: Negative for dysuria. Musculoskeletal: Negative for back pain. Skin: Negative for rash. Neurological: Negative for headaches, focal weakness or numbness.   ____________________________________________   PHYSICAL EXAM:  VITAL SIGNS: ED Triage Vitals  Enc Vitals Group     BP 05/17/17 0329 99/70  Pulse Rate 05/17/17 0329 72     Resp 05/17/17 0329 (!) 22     Temp 05/17/17 0329 (!) 97.5 F (36.4 C)     Temp Source 05/17/17 0329 Axillary     SpO2 05/17/17 0329 99 %     Weight 05/17/17 0334 145 lb (65.8 kg)     Height 05/17/17 0334 5\' 8"  (1.727 m)     Head Circumference --      Peak Flow --      Pain Score --      Pain Loc --      Pain Edu? --      Excl. in GC? --      Constitutional: Cachectic and chronically ill-appearing with elevated respiratory rate Eyes: PERRL EOMI. Head: Atraumatic. Nose: No congestion/rhinnorhea. Mouth/Throat: No trismus Neck: No stridor.  Positive for JVD but can lie flat  Cardiovascular: Normal rate, regular rhythm. Grossly normal heart sounds.  Good peripheral circulation. Respiratory: Increased respiratory effort.  No retractions.  Mild crackles throughout Gastrointestinal: Soft nontender Musculoskeletal: No lower extremity edema   Neurologic:  Normal speech and language. No gross focal neurologic deficits are appreciated. Skin:  Skin is warm, dry and intact. No rash noted. 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    ____________________________________________   DIFFERENTIAL includes but not limited to  Influenza, dehydration, acute coronary syndrome, urinary tract infection ____________________________________________   LABS (all labs ordered are listed, but only abnormal results are displayed)  Labs Reviewed  INFLUENZA PANEL BY PCR (TYPE A & B) - Abnormal; Notable for the following components:      Result Value   Influenza A By PCR POSITIVE (*)    All other components within normal limits  COMPREHENSIVE METABOLIC PANEL - Abnormal; Notable for the following components:   Sodium 134 (*)    Chloride 100 (*)    BUN 45 (*)    Creatinine, Ser 1.94 (*)    Albumin 3.2 (*)    AST 47 (*)    Total Bilirubin 1.8 (*)    GFR calc non Af Amer 32 (*)    GFR calc Af Amer 37 (*)    All other components within normal limits  LACTIC ACID, PLASMA - Abnormal; Notable for the following components:   Lactic Acid, Venous 2.1 (*)    All other components within normal limits  LACTIC ACID, PLASMA - Abnormal; Notable for the following components:   Lactic Acid, Venous 2.0 (*)    All other components within normal limits  CBC WITH DIFFERENTIAL/PLATELET - Abnormal; Notable for the following components:   RBC 6.15 (*)    HCT 52.3 (*)     MCHC 31.9 (*)    RDW 22.2 (*)    All other components within normal limits  TROPONIN I - Abnormal; Notable for the following components:   Troponin I 0.07 (*)    All other components within normal limits  BRAIN NATRIURETIC PEPTIDE - Abnormal; Notable for the following components:   B Natriuretic Peptide >4,500.0 (*)    All other components within normal limits  URINALYSIS, COMPLETE (UACMP) WITH MICROSCOPIC - Abnormal; Notable for the following components:   Color, Urine AMBER (*)    APPearance CLEAR (*)    All other components within normal limits  LIPID PANEL - Abnormal; Notable for the following components:   HDL 28 (*)    All other components within normal limits  PREALBUMIN - Abnormal; Notable for the following components:   Prealbumin 8.8 (*)    All  other components within normal limits  CEA - Abnormal; Notable for the following components:   CEA 8.0 (*)    All other components within normal limits  TROPONIN I - Abnormal; Notable for the following components:   Troponin I 0.06 (*)    All other components within normal limits  TROPONIN I - Abnormal; Notable for the following components:   Troponin I 0.04 (*)    All other components within normal limits  TROPONIN I - Abnormal; Notable for the following components:   Troponin I 0.04 (*)    All other components within normal limits  BASIC METABOLIC PANEL - Abnormal; Notable for the following components:   Sodium 134 (*)    Glucose, Bld 111 (*)    BUN 44 (*)    Creatinine, Ser 1.52 (*)    GFR calc non Af Amer 43 (*)    GFR calc Af Amer 50 (*)    All other components within normal limits  CBC - Abnormal; Notable for the following components:   RBC 5.94 (*)    RDW 21.7 (*)    All other components within normal limits  BODY FLUID CULTURE  CULTURE, GROUP A STREP (THRC)  LIPASE, BLOOD  AFP TUMOR MARKER  CA 19-9 (SERIAL)  TSH  PHOSPHORUS  MAGNESIUM  CYTOLOGY - NON PAP    Lab work reviewed by me with slightly  elevated troponin which is nonspecific and likely secondary to demand.  He is also influenza A positive __________________________________________  EKG  ED ECG REPORT I, Merrily BrittleNeil Dakia Schifano, the attending physician, personally viewed and interpreted this ECG.  Date: 05/17/2017 EKG Time:  Rate: 82 Rhythm: normal sinus rhythm QRS Axis: normal Intervals: normal ST/T Wave abnormalities: V5 V6 with deep T wave inversions which are new Narrative Interpretation: Concerning for possible ischemia  ____________________________________________  RADIOLOGY  Chest x-ray reviewed by me shows bibasilar pleural effusions with compressive atelectasis ____________________________________________   PROCEDURES  Procedure(s) performed: no  Procedures  Critical Care performed: no  Observation: no ____________________________________________   INITIAL IMPRESSION / ASSESSMENT AND PLAN / ED COURSE  Pertinent labs & imaging results that were available during my care of the patient were reviewed by me and considered in my medical decision making (see chart for details).  The patient arrives with elevated respiratory rate and appears acute on chronically ill.  He does have crackles in his lungs.  Influenza testing is positive and chest x-ray shows what appear to be new pleural effusions.  Differential is extremely broad but I am concerned for pneumonia versus sepsis versus ARDS etc.  At this point as the patient has a number of metabolic abnormalities and increased respiratory rate I do believe he requires inpatient admission for fluid resuscitation as well as treatment with Tamiflu.  I discussed with the family who verbalized understanding and agreement with the plan.  I then discussed with the hospitalist who has graciously agreed to admit the patient to his service.      ____________________________________________   FINAL CLINICAL IMPRESSION(S) / ED DIAGNOSES  Final diagnoses:  Influenza A   Acute kidney injury (HCC)  Dehydration  Lactic acidosis      NEW MEDICATIONS STARTED DURING THIS VISIT:  Discharge Medication List as of 05/18/2017 11:47 AM    START taking these medications   Details  feeding supplement, ENSURE ENLIVE, (ENSURE ENLIVE) LIQD Take 237 mLs by mouth 3 (three) times daily between meals., Starting Mon 05/18/2017, Print    nicotine (NICODERM CQ - DOSED IN  MG/24 HOURS) 14 mg/24hr patch Place 1 patch (14 mg total) onto the skin daily., Starting Mon 05/18/2017, Print    nitroGLYCERIN (NITROSTAT) 0.4 MG SL tablet Place 1 tablet (0.4 mg total) under the tongue every 5 (five) minutes as needed for chest pain., Starting Mon 05/18/2017, Print    oseltamivir (TAMIFLU) 30 MG capsule Take 1 capsule (30 mg total) by mouth 2 (two) times daily for 3 days., Starting Mon 05/18/2017, Until Thu 05/21/2017, Print         Note:  This document was prepared using Dragon voice recognition software and may include unintentional dictation errors.     Merrily Brittle, MD 05/19/17 2107

## 2017-05-17 NOTE — H&P (Signed)
[employer] [facility] [place] [facility]   PATIENT NAME: Austin Zamora    MR#:  [id number]  DATE OF BIRTH:  03/30/1940  DATE OF ADMISSION:  05/17/2017  PRIMARY CARE PHYSICIAN: Lamar BlinksKowalski, Bruce J, MD   REQUESTING/REFERRING PHYSICIAN:   CHIEF COMPLAINT:   Chief Complaint  Patient presents with  . Generalized Body Aches    HISTORY OF PRESENT ILLNESS: Austin Zamora  is a 77 y.o. Zamora with a known history of paroxysmal A. fib, aortic insufficiency, alcohol abuse, dyspnea on exertion, presents to the emergency room with 2-week history of generalized weakness, fatigue, decreased p.o. intake, now unable to walk, rapid weight loss, sore throat, has come in contact with ex-wife with the flu, emergency room patient was found to be tachypneic, blood pressure 99/70, creatinine 1.9 with baseline 1.1, AST 47, total bili 1.8, troponin 0.07, EKG with lateral T wave inversion/rate controlled A. 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  PAST MEDICAL HISTORY: See HPI  PAST SURGICAL HISTORY: None  SOCIAL HISTORY:  Social History   Tobacco Use  . Smoking status: Current Every Day Smoker    Packs/day: 1.00    Types: Cigarettes  . Smokeless tobacco: Never Used  Substance Use Topics  . Alcohol use: Yes    FAMILY HISTORY: HTN, CAD  DRUG ALLERGIES: NKDA  REVIEW OF SYSTEMS:   CONSTITUTIONAL: No fever, + fatigue, weakness.  EYES: No blurred or double vision.  EARS, NOSE, AND THROAT: No tinnitus or ear pain. + Sore throat RESPIRATORY: +  cough, shortness of breath, wheezing no hemoptysis.  CARDIOVASCULAR: No chest pain, orthopnea, edema.  GASTROINTESTINAL: No nausea, vomiting, diarrhea or abdominal pain.  GENITOURINARY: No dysuria, hematuria.  ENDOCRINE: No polyuria, nocturia,  HEMATOLOGY: No anemia, easy bruising or bleeding SKIN: No rash or lesion. MUSCULOSKELETAL: No joint pain or arthritis.   NEUROLOGIC: No tingling, numbness, weakness.  PSYCHIATRY: No anxiety or depression.   MEDICATIONS AT HOME:  Prior to Admission medications   Not on File      PHYSICAL EXAMINATION:   VITAL SIGNS: Blood pressure 99/70, pulse 72, temperature (!) 97.5 F (36.4 C), temperature source Axillary, resp. rate (!) 22, height 5\' 8"  (1.727 m), weight 65.8 kg (145 lb), SpO2 99 %.  GENERAL:  77 y.o.-year-old patient lying in the bed with no acute distress.  Cachectic appearing EYES: Pupils equal, round, reactive to light and accommodation. No scleral icterus. Extraocular muscles intact.  HEENT: Head atraumatic, normocephalic. Oropharynx and nasopharynx clear.  NECK:  Supple, no jugular venous distention. No thyroid enlargement, no tenderness.  LUNGS: Diminished breath sounds with rhonchi bilaterally. No use of accessory muscles of respiration.  CARDIOVASCULAR: S1, S2 normal. No murmurs, rubs, or gallops.  ABDOMEN: Soft, nontender, nondistended. Bowel sounds present. No organomegaly or mass.  EXTREMITIES: No pedal edema, cyanosis, or clubbing.  Diffuse muscular wasting NEUROLOGIC: Cranial nerves II through XII are intact. MAES. Gait not checked.  PSYCHIATRIC: The patient is alert and oriented x 3.  SKIN: No obvious rash, lesion, or ulcer.   LABORATORY PANEL:   CBC Recent Labs  Lab 05/17/17 0410  WBC 7.0  HGB 16.7  HCT 52.3*  PLT 285  MCV 85.0  MCH 27.2  MCHC 31.9*  RDW 22.2*  LYMPHSABS 1.7  MONOABS 0.8  EOSABS 0.1  BASOSABS 0.1    ------------------------------------------------------------------------------------------------------------------  Chemistries  Recent Labs  Lab 05/17/17 0410  NA 134*  K 4.1  CL 100*  CO2 23  GLUCOSE 97  BUN 45*  CREATININE 1.94*  CALCIUM 9.7  AST 47*  ALT 29  ALKPHOS 98  BILITOT 1.8*   ------------------------------------------------------------------------------------------------------------------ estimated creatinine clearance is 30.6 mL/min (A) (by C-G formula based on SCr of 1.94 mg/dL (H)). ------------------------------------------------------------------------------------------------------------------ No results for input(s): TSH, T4TOTAL, T3FREE, THYROIDAB in the last 72 hours.  Invalid input(s): FREET3   Coagulation profile No results for input(s): INR, PROTIME in the last 168 hours. ------------------------------------------------------------------------------------------------------------------- No results for input(s): DDIMER in the last 72 hours. -------------------------------------------------------------------------------------------------------------------  Cardiac Enzymes Recent Labs  Lab 05/17/17 0410  TROPONINI 0.07*   ------------------------------------------------------------------------------------------------------------------ Invalid input(s): POCBNP  ---------------------------------------------------------------------------------------------------------------  Urinalysis No results found for: COLORURINE, APPEARANCEUR, LABSPEC, PHURINE, GLUCOSEU, HGBUR, BILIRUBINUR, KETONESUR, PROTEINUR, UROBILINOGEN, NITRITE, LEUKOCYTESUR   RADIOLOGY: Dg Chest 2 View  Result Date: 05/17/2017 CLINICAL DATA:  Shortness of breath.  Flu. EXAM: CHEST - 2 VIEW COMPARISON:  Chest CT 07/29/2012 FINDINGS: The heart is enlarged. There is atherosclerosis of the thoracic aorta. Moderate right and small left pleural effusion with associated bibasilar opacity.  No definite pulmonary edema. Right lung base calcification is pleural on prior CT. Upper lungs are clear. No pneumothorax. Chronic degenerative change of both shoulders. Flaring of the distal right clavicle may be sequela of remote prior injury. IMPRESSION: 1. Bilateral pleural effusions, moderate on the right and small on the left with adjacent basilar opacities, likely compressive atelectasis. 2. Cardiomegaly. Electronically Signed   By: Rubye Oaks M.D.   On: 05/17/2017 03:48   Ct Chest Wo Contrast  Result Date: 05/17/2017 CLINICAL DATA:  Acute resp illness, > 43 years old. Generalized weakness for 2 weeks. Weight loss. EXAM: CT CHEST WITHOUT CONTRAST TECHNIQUE: Multidetector CT imaging of the chest was performed following the standard protocol without IV contrast. COMPARISON:  Radiographs earlier this day.  Chest CT 07/29/2012 FINDINGS: Cardiovascular: Aneurysmal dilatation of the ascending aorta measuring 5.4 cm, previously 5.0 cm. No periaortic stranding. There are aortic valvular calcifications and mitral annulus calcifications. Multi chamber cardiomegaly. Coronary artery calcifications. Small pericardial effusion with fluid in the pericardial recesses. Mediastinum/Nodes: No definite mediastinal adenopathy. No evidence of hilar adenopathy, limited assessment given lack of IV contrast. Heterogeneous thyroid gland without dominant nodule. Mildly patulous mid esophagus. Lungs/Pleura: Moderate to large right and moderate left pleural effusion. Adjacent airspace opacities typical of compressive atelectasis. Mild apical predominant paraseptal emphysema. No pulmonary mass or dominant nodule. The previous left lower lobe pulmonary nodule is obscured by atelectasis. Unchanged pleural based calcified density in the right lower hemithorax measuring approximately 2.1 cm. Trachea and proximal bronchi remain patent. Upper Abdomen: Atherosclerosis of upper abdominal vasculature. There is parenchymal atrophy of the  pancreas. Musculoskeletal: There are no acute or suspicious osseous abnormalities. Mild degenerative change in the spine. Degenerative change in both shoulders, left greater than right. IMPRESSION: 1. Moderate to large right and moderate left pleural effusion. Adjacent dependent opacities are typical of compressive atelectasis. 2. 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 Recommend semi-annual imaging followup by CTA or MRA and referral to cardiothoracic surgery if not already obtained. This recommendation follows 2010 ACCF/AHA/AATS/ACR/ASA/SCA/SCAI/SIR/STS/SVM Guidelines for the Diagnosis and Management of Patients With Thoracic Aortic Disease. Circulation. 2010; 121: Z610-R604 3. Aortic atherosclerosis and coronary artery calcifications. Aortic  valvular calcifications and mitral annulus calcifications. Mild multi chamber cardiomegaly. 4. Mild emphysema. 5. Unchanged right pleural calcified density. Aortic Atherosclerosis (ICD10-I70.0) and Emphysema (ICD10-J43.9). Aortic Atherosclerosis (ICD10-I70.0). Electronically Signed   By: Rubye Oaks M.D.   On: 05/17/2017 05:26    EKG: Orders placed or performed during the hospital encounter of 05/17/17  . ED EKG  . ED EKG  . EKG 12-Lead  . EKG 12-Lead    IMPRESSION AND PLAN: 1 acute influenza A  Tamiflu twice daily for 5-day course, supportive care  2 acute bilateral pleural effusions with compressive atelectasis Moderate to large on the right, moderate on the left Hold Eliquis, consult IR for thoracentesis  3 acute kidney injury BL Cr 1.1 two years ago, appears euvolemic Check renal ultrasound, consult nephrology for expert opinion, strict I&O monitoring, daily weights  4 acute elevated troponins Suspect due to demand ischemia Cycle cardiac enzymes, check echocardiogram, nitroglycerin as needed chest pain, check lipids, IV morphine as needed breakthrough pain, supplement oxygen as needed,  5 history of paroxysmal A. Fib Hold  Eliquis as stated above for thoracentesis Complete MAR when available Start low-dose Coreg for now twice daily given relatively low blood pressures  6 acute cachexia Secondary to unknown etiology Dietary consulted, check prealbumin level, will need cancer workup once stable -check CEA level, CA 19-9, AFP  7 history of alcohol abuse Stable Placed on alcohol withdrawal protocol  8 chronic tobacco smoking abuse/dependency Nicotine patch daily and cessation counseling ordered   All the records are reviewed and case discussed with ED provider. Management plans discussed with the patient, family and they are in agreement.  CODE STATUS:full Code Status History    Date Active Date Inactive Code Status Order ID Comments User Context   09/06/2014 04:29 09/06/2014 16:20 Full Code [id number]  Arnaldo Natal, MD Inpatient       TOTAL TIME TAKING CARE OF THIS PATIENT: 45 minutes.    Evelena Asa  M.D on 05/17/2017   Between 7am to 6pm - Pager - [phone]  After 6pm go to [email or web address] - password [email or web address]  [facility]  Office  [phone]  CC: Primary care physician; Lamar Blinks, MD   Note: This dictation was prepared with Dragon dictation along with smaller phrase technology. Any transcriptional errors that result from this process are unintentional.

## 2017-05-17 NOTE — Progress Notes (Signed)
[facility]. [facility]. CH waited for her to finish. CH spoke with patient and patient declines HCPOA at this time.

## 2017-05-17 NOTE — ED Triage Notes (Signed)
Pt arrives POV to triage with c/o generalized weakness x 2 weeks. Per son, pt has not been able to walk recently and has been not eating due to stomach pain. Pt has been having diarrhea with an episode Saturday AM. Pt has noted recent weight loss with swollen belly. Pt appears tired and pale at this time in triage.

## 2017-05-17 NOTE — ED Notes (Addendum)
Pt family members think that pt may have flu. Pt has weakness, not eating or drinking,  And rapid weight loss. Pt states it hurts to swallow. Pt family members at bedside.

## 2017-05-17 NOTE — ED Notes (Signed)
Date and time results received: 05/17/17 0448 (use smartphrase ".now" to insert current time)  Test: Troponin Critical Value: 0.07  Name of Provider Notified: Dr. Lamont Snowballifenbark  Orders Received? Or Actions Taken?:

## 2017-05-17 NOTE — ED Notes (Signed)
Date and time results received: 05/17/17 0556 (use smartphrase ".now" to insert current time)  Test: Lactic Acid Critical Value: 2.0  Name of Provider Notified: Dr. Lamont Snowballifenbark  Orders Received? Or Actions Taken?:

## 2017-05-17 NOTE — Consult Note (Signed)
NARESH ALTHAUS is Zamora 77 y.o. male  [id number]  Primary Cardiologist: [facility] Reason for Consultation: chest pain and elevated troponin  HPI: this is Zamora 77 year old white male with Zamora past medical history of atrial fibrillation presented to the hospital with shortness of breath and atypical chest pain and weakness. Patient apparently has upper respiratory tract infection I.e. Influenza.   Review of Systems: patient has occasional soreness in the chest no orthopnea but has some PND no leg swelling no dizziness or passing out   History reviewed. No pertinent past medical history.  Medications Prior to Admission  Medication Sig Dispense Refill  . ELIQUIS 5 MG TABS tablet Take 5 mg by mouth 2 (two) times daily.     . furosemide (LASIX) 20 MG tablet Take 20 mg by mouth daily.     Marland Kitchen lisinopril (PRINIVIL,ZESTRIL) 20 MG tablet Take 20 mg by mouth daily.     . metoprolol succinate (TOPROL-XL) 50 MG 24 hr tablet Take 50 mg by mouth daily.     Marland Kitchen PROAIR HFA 108 (90 Base) MCG/ACT inhaler        . apixaban  5 mg Oral BID  . aspirin EC  325 mg Oral Daily  . budesonide (PULMICORT) nebulizer solution  0.5 mg Nebulization BID  . feeding supplement (ENSURE ENLIVE)  237 mL Oral TID BM  . folic acid  1 mg Oral Daily  . guaiFENesin  600 mg Oral BID  . metoprolol succinate  50 mg Oral Daily  . multivitamin with minerals  1 tablet Oral Daily  . nicotine  14 mg Transdermal Daily  . oseltamivir  30 mg Oral BID  . thiamine  100 mg Oral Daily   Or  . thiamine  100 mg Intravenous Daily    Infusions:   No Known Allergies  Social History   Socioeconomic History  . Marital status: Divorced    Spouse name: Not on file  . Number of children: Not on file  . Years of education: Not on file  . Highest education level: Not on file  Social Needs  . Financial resource strain: Not on file  . Food insecurity - worry: Not on file  . Food insecurity - inability: Not on file  . Transportation needs -  medical: Not on file  . Transportation needs - non-medical: Not on file  Occupational History  . Not on file  Tobacco Use  . Smoking status: Current Every Day Smoker    Packs/day: 1.00    Types: Cigarettes  . Smokeless tobacco: Never Used  Substance and Sexual Activity  . Alcohol use: Yes  . Drug use: No  . Sexual activity: Not on file  Other Topics Concern  . Not on file  Social History Narrative  . Not on file    No family history on file.  PHYSICAL EXAM: Vitals:   05/17/17 1940 05/17/17 2008  BP: 103/80   Pulse: (!) 58   Resp: 20   Temp:    SpO2: 93% 95%     Intake/Output Summary (Last 24 hours) at 05/17/2017 2039 Last data filed at 05/17/2017 1600 Gross per 24 hour  Intake 307.5 ml  Output 425 ml  Net -117.5 ml    General:  Well appearing. No respiratory difficulty HEENT: normal Neck: supple. no JVD. Carotids 2+ bilat; no bruits. No lymphadenopathy or thryomegaly appreciated. Cor: PMI nondisplaced. Regular rate & rhythm. No rubs, gallops or murmurs. Lungs: clear Abdomen: soft, nontender, nondistended. No hepatosplenomegaly.  No bruits or masses. Good bowel sounds. Extremities: no cyanosis, clubbing, rash, edema Neuro: alert & oriented x 3, cranial nerves grossly intact. moves all 4 extremities w/o difficulty. Affect pleasant.  ECG: atrial fibrillation with ventricular rate 82 bpm with LVH and T-wave inversion in the lateral leads suggestive of ischemia  Results for orders placed or performed during the hospital encounter of 05/17/17 (from the past 24 hour(s))  Influenza panel by PCR (type Zamora & B)     Status: Abnormal   Collection Time: 05/17/17  4:10 AM  Result Value Ref Range   Influenza Zamora By PCR POSITIVE (Zamora) NEGATIVE   Influenza B By PCR NEGATIVE NEGATIVE  Comprehensive metabolic panel     Status: Abnormal   Collection Time: 05/17/17  4:10 AM  Result Value Ref Range   Sodium 134 (L) 135 - 145 mmol/L   Potassium 4.1 3.5 - 5.1 mmol/L   Chloride 100 (L)  101 - 111 mmol/L   CO2 23 22 - 32 mmol/L   Glucose, Bld 97 65 - 99 mg/dL   BUN 45 (H) 6 - 20 mg/dL   Creatinine, Ser 6.961.94 (H) 0.61 - 1.24 mg/dL   Calcium 9.7 8.9 - 29.510.3 mg/dL   Total Protein 7.0 6.5 - 8.1 g/dL   Albumin 3.2 (L) 3.5 - 5.0 g/dL   AST 47 (H) 15 - 41 U/L   ALT 29 17 - 63 U/L   Alkaline Phosphatase 98 38 - 126 U/L   Total Bilirubin 1.8 (H) 0.3 - 1.2 mg/dL   GFR calc non Af Amer 32 (L) >60 mL/min   GFR calc Af Amer 37 (L) >60 mL/min   Anion gap 11 5 - 15  Lactic acid, plasma     Status: Abnormal   Collection Time: 05/17/17  4:10 AM  Result Value Ref Range   Lactic Acid, Venous 2.1 (HH) 0.5 - 1.9 mmol/L  Lactic acid, plasma     Status: Abnormal   Collection Time: 05/17/17  4:10 AM  Result Value Ref Range   Lactic Acid, Venous 2.0 (HH) 0.5 - 1.9 mmol/L  CBC with Differential     Status: Abnormal   Collection Time: 05/17/17  4:10 AM  Result Value Ref Range   WBC 7.0 3.8 - 10.6 K/uL   RBC 6.15 (H) 4.40 - 5.90 MIL/uL   Hemoglobin 16.7 13.0 - 18.0 g/dL   HCT 28.452.3 (H) 13.240.0 - 44.052.0 %   MCV 85.0 80.0 - 100.0 fL   MCH 27.2 26.0 - 34.0 pg   MCHC 31.9 (L) 32.0 - 36.0 g/dL   RDW 10.222.2 (H) 72.511.5 - 36.614.5 %   Platelets 285 150 - 440 K/uL   Neutrophils Relative % 62 %   Neutro Abs 4.4 1.4 - 6.5 K/uL   Lymphocytes Relative 24 %   Lymphs Abs 1.7 1.0 - 3.6 K/uL   Monocytes Relative 12 %   Monocytes Absolute 0.8 0.2 - 1.0 K/uL   Eosinophils Relative 1 %   Eosinophils Absolute 0.1 0 - 0.7 K/uL   Basophils Relative 1 %   Basophils Absolute 0.1 0 - 0.1 K/uL  Troponin I     Status: Abnormal   Collection Time: 05/17/17  4:10 AM  Result Value Ref Range   Troponin I 0.07 (HH) <0.03 ng/mL  Brain natriuretic peptide     Status: Abnormal   Collection Time: 05/17/17  4:10 AM  Result Value Ref Range   B Natriuretic Peptide >4,500.0 (H) 0.0 - 100.0 pg/mL  Lipase, blood     Status: None   Collection Time: 05/17/17  4:10 AM  Result Value Ref Range   Lipase 48 11 - 51 U/L  Lipid panel      Status: Abnormal   Collection Time: 05/17/17  7:08 AM  Result Value Ref Range   Cholesterol 104 0 - 200 mg/dL   Triglycerides 79 <191 mg/dL   HDL 28 (L) >47 mg/dL   Total CHOL/HDL Ratio 3.7 RATIO   VLDL 16 0 - 40 mg/dL   LDL Cholesterol 60 0 - 99 mg/dL  Prealbumin     Status: Abnormal   Collection Time: 05/17/17  7:08 AM  Result Value Ref Range   Prealbumin 8.8 (L) 18 - 38 mg/dL  Troponin I     Status: Abnormal   Collection Time: 05/17/17  7:08 AM  Result Value Ref Range   Troponin I 0.06 (HH) <0.03 ng/mL  TSH     Status: None   Collection Time: 05/17/17  7:08 AM  Result Value Ref Range   TSH 1.663 0.350 - 4.500 uIU/mL  Troponin I     Status: Abnormal   Collection Time: 05/17/17 12:44 PM  Result Value Ref Range   Troponin I 0.04 (HH) <0.03 ng/mL  Urinalysis, Complete w Microscopic     Status: Abnormal   Collection Time: 05/17/17  1:27 PM  Result Value Ref Range   Color, Urine AMBER (Zamora) YELLOW   APPearance CLEAR (Zamora) CLEAR   Specific Gravity, Urine 1.013 1.005 - 1.030   pH 5.0 5.0 - 8.0   Glucose, UA NEGATIVE NEGATIVE mg/dL   Hgb urine dipstick NEGATIVE NEGATIVE   Bilirubin Urine NEGATIVE NEGATIVE   Ketones, ur NEGATIVE NEGATIVE mg/dL   Protein, ur NEGATIVE NEGATIVE mg/dL   Nitrite NEGATIVE NEGATIVE   Leukocytes, UA NEGATIVE NEGATIVE   RBC / HPF 0-5 0 - 5 RBC/hpf   WBC, UA 0-5 0 - 5 WBC/hpf   Bacteria, UA NONE SEEN NONE SEEN   Squamous Epithelial / LPF NONE SEEN NONE SEEN   Mucus PRESENT    Hyaline Casts, UA PRESENT   Troponin I     Status: Abnormal   Collection Time: 05/17/17  6:36 PM  Result Value Ref Range   Troponin I 0.04 (HH) <0.03 ng/mL   Dg Chest 2 View  Result Date: 05/17/2017 CLINICAL DATA:  Shortness of breath.  Flu. EXAM: CHEST - 2 VIEW COMPARISON:  Chest CT 07/29/2012 FINDINGS: The heart is enlarged. There is atherosclerosis of the thoracic aorta. Moderate right and small left pleural effusion with associated bibasilar opacity. No definite pulmonary  edema. Right lung base calcification is pleural on prior CT. Upper lungs are clear. No pneumothorax. Chronic degenerative change of both shoulders. Flaring of the distal right clavicle may be sequela of remote prior injury. IMPRESSION: 1. Bilateral pleural effusions, moderate on the right and small on the left with adjacent basilar opacities, likely compressive atelectasis. 2. Cardiomegaly. Electronically Signed   By: Rubye Oaks M.D.   On: 05/17/2017 03:48   Ct Chest Wo Contrast  Result Date: 05/17/2017 CLINICAL DATA:  Acute resp illness, > 41 years old. Generalized weakness for 2 weeks. Weight loss. EXAM: CT CHEST WITHOUT CONTRAST TECHNIQUE: Multidetector CT imaging of the chest was performed following the standard protocol without IV contrast. COMPARISON:  Radiographs earlier this day.  Chest CT 07/29/2012 FINDINGS: Cardiovascular: Aneurysmal dilatation of the ascending aorta measuring 5.4 cm, previously 5.0 cm. No periaortic stranding. There are aortic valvular calcifications and  mitral annulus calcifications. Multi chamber cardiomegaly. Coronary artery calcifications. Small pericardial effusion with fluid in the pericardial recesses. Mediastinum/Nodes: No definite mediastinal adenopathy. No evidence of hilar adenopathy, limited assessment given lack of IV contrast. Heterogeneous thyroid gland without dominant nodule. Mildly patulous mid esophagus. Lungs/Pleura: Moderate to large right and moderate left pleural effusion. Adjacent airspace opacities typical of compressive atelectasis. Mild apical predominant paraseptal emphysema. No pulmonary mass or dominant nodule. The previous left lower lobe pulmonary nodule is obscured by atelectasis. Unchanged pleural based calcified density in the right lower hemithorax measuring approximately 2.1 cm. Trachea and proximal bronchi remain patent. Upper Abdomen: Atherosclerosis of upper abdominal vasculature. There is parenchymal atrophy of the pancreas.  Musculoskeletal: There are no acute or suspicious osseous abnormalities. Mild degenerative change in the spine. Degenerative change in both shoulders, left greater than right. IMPRESSION: 1. Moderate to large right and moderate left pleural effusion. Adjacent dependent opacities are typical of compressive atelectasis. 2. 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 Recommend semi-annual imaging followup by CTA or MRA and referral to cardiothoracic surgery if not already obtained. This recommendation follows 2010 ACCF/AHA/AATS/ACR/ASA/SCA/SCAI/SIR/STS/SVM Guidelines for the Diagnosis and Management of Patients With Thoracic Aortic Disease. Circulation. 2010; 121: G956-O130 3. Aortic atherosclerosis and coronary artery calcifications. Aortic valvular calcifications and mitral annulus calcifications. Mild multi chamber cardiomegaly. 4. Mild emphysema. 5. Unchanged right pleural calcified density. Aortic Atherosclerosis (ICD10-I70.0) and Emphysema (ICD10-J43.9). Aortic Atherosclerosis (ICD10-I70.0). Electronically Signed   By: Rubye Oaks M.D.   On: 05/17/2017 05:26   US Renal  Result Date: 05/17/2017 CLINICAL DATA:  Acute renal failure EXAM: RENAL / URINARY TRACT ULTRASOUND COMPLETE COMPARISON:  05/17/2017 FINDINGS: Right Kidney: Length: 9.0 cm. Echogenicity within normal limits. No mass or hydronephrosis visualized. Left Kidney: Length: 9.5 cm. Echogenicity within normal limits. No mass or hydronephrosis visualized. Bladder: Appears normal for degree of bladder distention. Right pleural effusion noted. IMPRESSION: No acute finding by renal ultrasound. Right pleural effusion Electronically Signed   By: Judie Petit.  Shick M.D.   On: 05/17/2017 10:18     ASSESSMENT AND PLAN:atypical chest pain, and upper respiratory tract infection thought to be influenza associated with mildly elevated troponin. EKG shows atrial fibrillation with some ischemic changes in the lateral leads. Echocardiogram has severe left  ventricular systolic dysfunction with severe aortic stenosis. Aortic valve appears to be Zamora mobile and severely calcified with valve area less than 0.5 cm. Mildly elevated troponin appears to be due to demand ischemia and severe LV dysfunction and congestive heart failure No modification of medication is needed at this time.I advise supportive care.  Austin Zamora

## 2017-05-17 NOTE — ED Notes (Signed)
Date and time results received: 05/17/17 0445 (use smartphrase ".now" to insert current time)  Test: Lactic Acid Critical Value: 2.1  Name of Provider Notified: Dr. Lamont Snowballifenbark  Orders Received? Or Actions Taken?:

## 2017-05-17 NOTE — Plan of Care (Signed)
Pt afebrile throughout the day. Weaned of oxygen. C/o pain in chest, back and flank area. Poor appetite, dietician consulted.

## 2017-05-17 NOTE — Consult Note (Signed)
[facility]  CONSULT NOTE    Date: 05/17/2017                  Patient Name:  Austin Zamora  MRN: [id number]  DOB: 05/20/1940  Age / Sex: 77 y.o., male         PCP: Lamar BlinksKowalski, Bruce J, MD                 Service Requesting Consult: Dr. Juliene PinaMody                 Reason for Consult: Acute renal failure            History of Present Illness: Austin Zamora is a 77 y.o. white male with EtOH abuse, atrial fibrillation, tobacco abuse/COPD, hypertension, aortic valve sclerosis, who was [facility] on 05/17/2017 for Dehydration [E86.0] Lactic acidosis [E87.2] SOB (shortness of breath) [R06.02] Influenza A [J10.1] Acute kidney injury (HCC) [N17.9]   Patient states that he has been not eating well for last few days. He has been having progressive shortness of breath and productive cough. Diagnosed with influenza along with his wife. Found to have bilateral pleural effusions.   Started on NS at 16750mL/hr.   Creatinine 1.94 and nephrology consulted.    Medications: Outpatient medications: Medications Prior to Admission  Medication Sig Dispense Refill Last Dose  . ELIQUIS 5 MG TABS tablet Take 5 mg by mouth 2 (two) times daily.    05/16/2017 at Unknown time  . furosemide (LASIX) 20 MG tablet Take 20 mg by mouth daily.    05/16/2017 at Unknown time  . lisinopril (PRINIVIL,ZESTRIL) 20 MG tablet Take 20 mg by mouth daily.    05/16/2017 at Unknown time  . metoprolol succinate (TOPROL-XL) 50 MG 24 hr tablet Take 50 mg by mouth daily.    05/16/2017 at Unknown time  . PROAIR HFA 108 (90 Base) MCG/ACT inhaler    prn at prn    Current medications: Current Facility-Administered Medications  Medication Dose Route Frequency Provider Last Rate Last Dose  . acetaminophen (TYLENOL) tablet 650 mg  650 mg Oral Q6H PRN Salary, Montell D, MD       Or  . acetaminophen (TYLENOL) suppository 650 mg  650 mg Rectal Q6H PRN Salary, Montell D, MD      . apixaban (ELIQUIS) tablet 5 mg  5  mg Oral BID Mody, Sital, MD      . aspirin EC tablet 325 mg  325 mg Oral Daily Salary, Montell D, MD   325 mg at 05/17/17 0855  . budesonide (PULMICORT) nebulizer solution 0.5 mg  0.5 mg Nebulization BID Salary, Montell D, MD   0.5 mg at 05/17/17 0839  . folic acid (FOLVITE) tablet 1 mg  1 mg Oral Daily Salary, Montell D, MD   1 mg at 05/17/17 0855  . guaiFENesin (MUCINEX) 12 hr tablet 600 mg  600 mg Oral BID Salary, Montell D, MD      . heparin injection 5,000 Units  5,000 Units Subcutaneous Q8H Salary, Montell D, MD      . HYDROcodone-acetaminophen (NORCO/VICODIN) 5-325 MG per tablet 1-2 tablet  1-2 tablet Oral Q4H PRN Bertrum SolSalary, Montell D, MD   2 tablet at 05/17/17 0855  . ipratropium-albuterol (DUONEB) 0.5-2.5 (3) MG/3ML nebulizer solution 3 mL  3 mL Nebulization Q4H PRN Salary, Montell D, MD      . lisinopril (PRINIVIL,ZESTRIL) tablet 20 mg  20 mg Oral Daily Adrian SaranMody, Sital, MD      .  LORazepam (ATIVAN) tablet 1 mg  1 mg Oral Q6H PRN Salary, Evelena Asa, MD       Or  . LORazepam (ATIVAN) injection 1 mg  1 mg Intravenous Q6H PRN Salary, Montell D, MD      . metoprolol succinate (TOPROL-XL) 24 hr tablet 50 mg  50 mg Oral Daily Mody, Sital, MD      . morphine 2 MG/ML injection 2 mg  2 mg Intravenous Q2H PRN Salary, Montell D, MD      . multivitamin with minerals tablet 1 tablet  1 tablet Oral Daily Salary, Montell D, MD   1 tablet at 05/17/17 0855  . nicotine (NICODERM CQ - dosed in mg/24 hours) patch 14 mg  14 mg Transdermal Daily Salary, Montell D, MD   14 mg at 05/17/17 0855  . nitroGLYCERIN (NITROSTAT) SL tablet 0.4 mg  0.4 mg Sublingual Q5 min PRN Salary, Montell D, MD      . ondansetron (ZOFRAN) tablet 4 mg  4 mg Oral Q6H PRN Salary, Montell D, MD       Or  . ondansetron (ZOFRAN) injection 4 mg  4 mg Intravenous Q6H PRN Salary, Montell D, MD      . oseltamivir (TAMIFLU) capsule 30 mg  30 mg Oral BID Salary, Montell D, MD   30 mg at 05/17/17 0900  . thiamine (VITAMIN B-1) tablet 100 mg  100 mg  Oral Daily Salary, Montell D, MD   100 mg at 05/17/17 0855   Or  . thiamine (B-1) injection 100 mg  100 mg Intravenous Daily Salary, Montell D, MD          Allergies: No Known Allergies    Past Medical History: History reviewed. No pertinent past medical history.   Past Surgical History: History reviewed. No pertinent surgical history.   Family History: No family history on file.   Social History: Social History   Socioeconomic History  . Marital status: Divorced    Spouse name: Not on file  . Number of children: Not on file  . Years of education: Not on file  . Highest education level: Not on file  Social Needs  . Financial resource strain: Not on file  . Food insecurity - worry: Not on file  . Food insecurity - inability: Not on file  . Transportation needs - medical: Not on file  . Transportation needs - non-medical: Not on file  Occupational History  . Not on file  Tobacco Use  . Smoking status: Current Every Day Smoker    Packs/day: 1.00    Types: Cigarettes  . Smokeless tobacco: Never Used  Substance and Sexual Activity  . Alcohol use: Yes  . Drug use: No  . Sexual activity: Not on file  Other Topics Concern  . Not on file  Social History Narrative  . Not on file     Review of Systems: Review of Systems  Constitutional: Positive for chills, diaphoresis, malaise/fatigue and weight loss. Negative for fever.  HENT: Negative.  Negative for congestion, ear discharge, ear pain, hearing loss, nosebleeds, sinus pain, sore throat and tinnitus.   Eyes: Negative.  Negative for blurred vision, double vision, photophobia, pain, discharge and redness.  Respiratory: Positive for cough, sputum production, shortness of breath and wheezing. Negative for hemoptysis and stridor.   Cardiovascular: Negative.  Negative for chest pain, palpitations, orthopnea, claudication, leg swelling and PND.  Gastrointestinal: Positive for diarrhea and nausea. Negative for abdominal  pain, blood in stool, constipation, heartburn, melena  and vomiting.  Genitourinary: Negative.  Negative for dysuria, flank pain, frequency, hematuria and urgency.  Musculoskeletal: Negative.  Negative for back pain, falls, joint pain, myalgias and neck pain.  Skin: Negative.  Negative for itching and rash.  Neurological: Positive for weakness. Negative for dizziness, tingling, tremors, sensory change, speech change, focal weakness, seizures, loss of consciousness and headaches.  Endo/Heme/Allergies: Negative.  Negative for environmental allergies and polydipsia. Does not bruise/bleed easily.  Psychiatric/Behavioral: Negative.  Negative for depression, hallucinations, memory loss, substance abuse and suicidal ideas. The patient is not nervous/anxious and does not have insomnia.     Vital Signs: Blood pressure 109/79, pulse (!) 58, temperature 97.8 F (36.6 C), temperature source Axillary, resp. rate (!) 24, height 5\' 8"  (1.727 m), weight 65.5 kg (144 lb 4.8 oz), SpO2 96 %.  Weight trends: Filed Weights   05/17/17 0334 05/17/17 0649  Weight: 65.8 kg (145 lb) 65.5 kg (144 lb 4.8 oz)    Physical Exam: General: NAD, laying in bed  Head: Normocephalic, atraumatic. Moist oral mucosal membranes  Eyes: Anicteric, PERRL  Neck: Supple, trachea midline  Lungs:  Course breath sounds, crackles, diminished at bases  Heart: Regular rate and rhythm  Abdomen:  Soft, nontender  Extremities:  no peripheral edema.  Neurologic: Nonfocal, moving all four extremities  Skin: No lesions         Lab results: Basic Metabolic Panel: Recent Labs  Lab 05/17/17 0410  NA 134*  K 4.1  CL 100*  CO2 23  GLUCOSE 97  BUN 45*  CREATININE 1.94*  CALCIUM 9.7    Liver Function Tests: Recent Labs  Lab 05/17/17 0410  AST 47*  ALT 29  ALKPHOS 98  BILITOT 1.8*  PROT 7.0  ALBUMIN 3.2*   Recent Labs  Lab 05/17/17 0410  LIPASE 48   No results for input(s): AMMONIA in the last 168  hours.  CBC: Recent Labs  Lab 05/17/17 0410  WBC 7.0  NEUTROABS 4.4  HGB 16.7  HCT 52.3*  MCV 85.0  PLT 285    Cardiac Enzymes: Recent Labs  Lab 05/17/17 0410 05/17/17 0708  TROPONINI 0.07* 0.06*    BNP: Invalid input(s): POCBNP  CBG: No results for input(s): GLUCAP in the last 168 hours.  Microbiology: No results found for this or any previous visit.  Coagulation Studies: No results for input(s): LABPROT, INR in the last 72 hours.  Urinalysis: No results for input(s): COLORURINE, LABSPEC, PHURINE, GLUCOSEU, HGBUR, BILIRUBINUR, KETONESUR, PROTEINUR, UROBILINOGEN, NITRITE, LEUKOCYTESUR in the last 72 hours.  Invalid input(s): APPERANCEUR    Imaging: Dg Chest 2 View  Result Date: 05/17/2017 CLINICAL DATA:  Shortness of breath.  Flu. EXAM: CHEST - 2 VIEW COMPARISON:  Chest CT 07/29/2012 FINDINGS: The heart is enlarged. There is atherosclerosis of the thoracic aorta. Moderate right and small left pleural effusion with associated bibasilar opacity. No definite pulmonary edema. Right lung base calcification is pleural on prior CT. Upper lungs are clear. No pneumothorax. Chronic degenerative change of both shoulders. Flaring of the distal right clavicle may be sequela of remote prior injury. IMPRESSION: 1. Bilateral pleural effusions, moderate on the right and small on the left with adjacent basilar opacities, likely compressive atelectasis. 2. Cardiomegaly. Electronically Signed   By: Rubye Oaks M.D.   On: 05/17/2017 03:48   Ct Chest Wo Contrast  Result Date: 05/17/2017 CLINICAL DATA:  Acute resp illness, > 2 years old. Generalized weakness for 2 weeks. Weight loss. EXAM: CT CHEST WITHOUT CONTRAST TECHNIQUE: Multidetector CT  imaging of the chest was performed following the standard protocol without IV contrast. COMPARISON:  Radiographs earlier this day.  Chest CT 07/29/2012 FINDINGS: Cardiovascular: Aneurysmal dilatation of the ascending aorta measuring 5.4 cm,  previously 5.0 cm. No periaortic stranding. There are aortic valvular calcifications and mitral annulus calcifications. Multi chamber cardiomegaly. Coronary artery calcifications. Small pericardial effusion with fluid in the pericardial recesses. Mediastinum/Nodes: No definite mediastinal adenopathy. No evidence of hilar adenopathy, limited assessment given lack of IV contrast. Heterogeneous thyroid gland without dominant nodule. Mildly patulous mid esophagus. Lungs/Pleura: Moderate to large right and moderate left pleural effusion. Adjacent airspace opacities typical of compressive atelectasis. Mild apical predominant paraseptal emphysema. No pulmonary mass or dominant nodule. The previous left lower lobe pulmonary nodule is obscured by atelectasis. Unchanged pleural based calcified density in the right lower hemithorax measuring approximately 2.1 cm. Trachea and proximal bronchi remain patent. Upper Abdomen: Atherosclerosis of upper abdominal vasculature. There is parenchymal atrophy of the pancreas. Musculoskeletal: There are no acute or suspicious osseous abnormalities. Mild degenerative change in the spine. Degenerative change in both shoulders, left greater than right. IMPRESSION: 1. Moderate to large right and moderate left pleural effusion. Adjacent dependent opacities are typical of compressive atelectasis. 2. 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 Recommend semi-annual imaging followup by CTA or MRA and referral to cardiothoracic surgery if not already obtained. This recommendation follows 2010 ACCF/AHA/AATS/ACR/ASA/SCA/SCAI/SIR/STS/SVM Guidelines for the Diagnosis and Management of Patients With Thoracic Aortic Disease. Circulation. 2010; 121: Z610-R604 3. Aortic atherosclerosis and coronary artery calcifications. Aortic valvular calcifications and mitral annulus calcifications. Mild multi chamber cardiomegaly. 4. Mild emphysema. 5. Unchanged right pleural calcified density. Aortic  Atherosclerosis (ICD10-I70.0) and Emphysema (ICD10-J43.9). Aortic Atherosclerosis (ICD10-I70.0). Electronically Signed   By: Rubye Oaks M.D.   On: 05/17/2017 05:26   US Renal  Result Date: 05/17/2017 CLINICAL DATA:  Acute renal failure EXAM: RENAL / URINARY TRACT ULTRASOUND COMPLETE COMPARISON:  05/17/2017 FINDINGS: Right Kidney: Length: 9.0 cm. Echogenicity within normal limits. No mass or hydronephrosis visualized. Left Kidney: Length: 9.5 cm. Echogenicity within normal limits. No mass or hydronephrosis visualized. Bladder: Appears normal for degree of bladder distention. Right pleural effusion noted. IMPRESSION: No acute finding by renal ultrasound. Right pleural effusion Electronically Signed   By: Judie Petit.  Shick M.D.   On: 05/17/2017 10:18      Assessment & Plan: Mr. MALACAI GRANTZ is a 77 y.o. white male with EtOH abuse, atrial fibrillation, tobacco abuse/COPD, hypertension, aortic valve sclerosis, who was admitted to [facility] on 05/17/2017 for Dehydration [E86.0] Lactic acidosis [E87.2] SOB (shortness of breath) [R06.02] Influenza A [J10.1] Acute kidney injury (HCC) [N17.9]   1. Acute renal failure: Creatinine 1.94 on admission. Last creatinine was 1.11 with normal GFR from 09/2014.  Acute renal failure from prerenal azotemia.  Renal ultrasound reviewed with patient.  - Discontinue IV fluids. Encourage PO intake.  - Check urinalysis - Hold lisinopril.   2. 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 New finding on this admission.  - holding furosemide and lisinopril due to renal failure - Continue metoprolol - Appreciate cardiology input.   LOS: 0 Junie Engram 3/17/201912:21 PM

## 2017-05-17 NOTE — Progress Notes (Signed)
Family Meeting Note  Advance Directive:no  Today a meeting took place with the Patient.    The following clinical team members were present during this meeting:MD  The following were discussed:Patient's diagnosis: acute hypoxic respiratory failure with bilateral moderate to large pleural effusion Protein calorie malnutrition with cachexia Acute kidney injury   patient's progosis: Unable to determine and Goals for treatment: Full Code  Additional follow-up to be provided: chaplain consult to provide information for advanced dierectives  Time spent during discussion:16 minutes  Tamlyn Sides, Patricia PesaSITAL, MD

## 2017-05-17 NOTE — Progress Notes (Signed)
*  PRELIMINARY RESULTS* Echocardiogram 2D Echocardiogram has been performed.  Garrel Ridgelikeshia S Lind Ausley 05/17/2017, 9:50 AM

## 2017-05-17 NOTE — Progress Notes (Signed)
Initial Nutrition Assessment  DOCUMENTATION CODES:   Severe malnutrition in context of chronic illness  INTERVENTION:  Provide Ensure Enlive po TID, each supplement provides 350 kcal and 20 grams of protein. Patient prefers strawberry.  Continue MVI daily, folic acid 1 mg daily, thiamine 100 mg daily.  Monitor magnesium, potassium, and phosphorus daily for at least 3 days, MD to replete as needed, as pt is at risk for refeeding syndrome given severe malnutrition, hx of EtOH abuse.  Encouraged adequate intake of calories and protein at meals. If patient is unable to keep up with his calorie and protein needs from meals and oral nutrition supplements will need to consider liberalizing diet.  NUTRITION DIAGNOSIS:   Severe Malnutrition related to chronic illness(COPD, EtOH abuse) as evidenced by severe fat depletion, moderate muscle depletion, severe muscle depletion.  GOAL:   Patient will meet greater than or equal to 90% of their needs  MONITOR:   PO intake, Supplement acceptance, Labs, Weight trends, Skin, I & O's  REASON FOR ASSESSMENT:   Malnutrition Screening Tool, Consult Assessment of nutrition requirement/status  ASSESSMENT:   77 year old male with PMHx of A-fib, EtOH abuse, tobacco abuse, COPD, HTN, aortic valve sclerosis who is now admitted with generalized weakness, AKI, acute hypoxic respiratory failure with bilateral pleural effusions, and found to be influenza A positive.   -Pt had echocardiogram 3/17 which found LV EF 20%.  Met with patient at bedside. He was having difficulty staying awake so he is not a great historian today. No family members present. He reports his appetite is "okay" but has been decreased lately. RD attempted to collect food recall with patient to better understand usual intake but he is unable to provide good nutrition history at this time. He is amenable to drinking Ensure to help meet calorie/protein needs. He prefers Soil [employer]. He is  having some diarrhea.   Reports UBW 165 lbs. Patient is unaware he has lost weight. He was 163.7 lbs on 09/06/2014 and has lost 19.4 lbs (11.9% body weight) sometime since then.  Medications reviewed and include: Eliquis, folic acid 1 mg daily, heparin, MVI daily, Tamiflu, thiamine 100 mg daily.  Labs reviewed: Sodium 134, Chloride 100, Bun 45, Creatinine 1.94, BNP >4500, Lactic acid 2, Troponin 0.04, HDL 28.  Discussed with RN. Patient is dehydrated and having diarrhea. Unable to provide IV fluids at this time due to heart failure. Pending cardiology consult. Patient ate about 50% of lunch today.  NUTRITION - FOCUSED PHYSICAL EXAM:    Most Recent Value  Orbital Region  Severe depletion  Upper Arm Region  Severe depletion  Thoracic and Lumbar Region  Severe depletion  Buccal Region  Severe depletion  Temple Region  Severe depletion  Clavicle Bone Region  Severe depletion  Clavicle and Acromion Bone Region  Severe depletion  Scapular Bone Region  Unable to assess  Dorsal Hand  Moderate depletion  Patellar Region  Severe depletion  Anterior Thigh Region  Severe depletion  Posterior Calf Region  Moderate depletion  Edema (RD Assessment)  None [none found on upper or lower extremities]  Hair  Reviewed  Eyes  Reviewed  Mouth  Unable to assess [RD asked pt to open mouth but he kept falling asleep]  Skin  Reviewed [skin tenting present]  Nails  Reviewed     Diet Order:  Diet Heart Room service appropriate? Yes; Fluid consistency: Thin  EDUCATION NEEDS:   Not appropriate for education at this time  Skin:  Skin Assessment:  Skin Integrity Issues:(red area to sacrum)  Last BM:  Unknown  Height:   Ht Readings from Last 1 Encounters:  05/17/17 '5\' 8"'$  (1.727 m)    Weight:   Wt Readings from Last 1 Encounters:  05/17/17 144 lb 4.8 oz (65.5 kg)    Ideal Body Weight:  70 kg  BMI:  Body mass index is 21.94 kg/m.  Estimated Nutritional Needs:   Kcal:  1645-1920 (MSJ x  1.2-1.4)  Protein:  85-100 grams (1.3-1.5 grams/kg)  Fluid:  1.6 L/day (25 mL/kg)  Willey Blade, MS, RD, LDN Office: [phone] Pager: [phone] After Hours/Weekend Pager: [phone]

## 2017-05-17 NOTE — Progress Notes (Signed)
[employer] - [place] [facility]   PATIENT NAME: Austin FoldsHarry Zamora    MR#:  [id number]  DATE OF BIRTH:  03/16/1940  SUBJECTIVE:   Patient presented with generalized weakness  REVIEW OF SYSTEMS:    Review of Systems  Constitutional: Negative for fever, chills ++ weakness and weight loss HENT: Negative for ear pain, nosebleeds, congestion, facial swelling, rhinorrhea, neck pain, neck stiffness and ear discharge.   Respiratory: Negative for cough, shortness of breath, wheezing  Cardiovascular: Negative for chest pain, palpitations and leg swelling.  Gastrointestinal: Negative for heartburn, abdominal pain, vomiting, diarrhea or consitpation Genitourinary: Negative for dysuria, urgency, frequency, hematuria Musculoskeletal: Negative for back pain or joint pain Neurological: Negative for dizziness, seizures, syncope, focal weakness,  numbness and headaches.  Hematological: Does not bruise/bleed easily.  Psychiatric/Behavioral: Negative for hallucinations, confusion, dysphoric mood    Tolerating Diet: yes      DRUG ALLERGIES:  No Known Allergies  VITALS:  Blood pressure 109/79, pulse (!) 58, temperature 97.8 F (36.6 C), temperature source Axillary, resp. rate (!) 24, height 5\' 8"  (1.727 m), weight 65.5 kg (144 lb 4.8 oz), SpO2 96 %.  PHYSICAL EXAMINATION:  Constitutional: Appears thin cachectic no distress. HENT: Normocephalic. Marland Kitchen. Oropharynx is clear and moist.  Eyes: Conjunctivae and EOM are normal. PERRLA, no scleral icterus.  Neck: Normal ROM. Neck supple. No JVD. No tracheal deviation. CVS: RRR, S1/S2 +, no murmurs, no gallops, no carotid bruit.  Pulmonary: Normal effort with decreased breath sounds right base  abdominal: Soft. BS +,  no distension, tenderness, rebound or guarding.  Musculoskeletal: Normal range of motion. No edema and no tenderness.  Neuro: Alert. CN 2-12 grossly intact. No focal deficits. Skin: Skin is warm and dry. No rash  noted. Psychiatric: Normal mood and affect.      LABORATORY PANEL:   CBC Recent Labs  Lab 05/17/17 0410  WBC 7.0  HGB 16.7  HCT 52.3*  PLT 285   ------------------------------------------------------------------------------------------------------------------  Chemistries  Recent Labs  Lab 05/17/17 0410  NA 134*  K 4.1  CL 100*  CO2 23  GLUCOSE 97  BUN 45*  CREATININE 1.94*  CALCIUM 9.7  AST 47*  ALT 29  ALKPHOS 98  BILITOT 1.8*   ------------------------------------------------------------------------------------------------------------------  Cardiac Enzymes Recent Labs  Lab 05/17/17 0410 05/17/17 0708  TROPONINI 0.07* 0.06*   ------------------------------------------------------------------------------------------------------------------  RADIOLOGY:  Dg Chest 2 View  Result Date: 05/17/2017 CLINICAL DATA:  Shortness of breath.  Flu. EXAM: CHEST - 2 VIEW COMPARISON:  Chest CT 07/29/2012 FINDINGS: The heart is enlarged. There is atherosclerosis of the thoracic aorta. Moderate right and small left pleural effusion with associated bibasilar opacity. No definite pulmonary edema. Right lung base calcification is pleural on prior CT. Upper lungs are clear. No pneumothorax. Chronic degenerative change of both shoulders. Flaring of the distal right clavicle may be sequela of remote prior injury. IMPRESSION: 1. Bilateral pleural effusions, moderate on the right and small on the left with adjacent basilar opacities, likely compressive atelectasis. 2. Cardiomegaly. Electronically Signed   By: Rubye OaksMelanie  Ehinger M.D.   On: 05/17/2017 03:48   Ct Chest Wo Contrast  Result Date: 05/17/2017 CLINICAL DATA:  Acute resp illness, > 76 years old. Generalized weakness for 2 weeks. Weight loss. EXAM: CT CHEST WITHOUT CONTRAST TECHNIQUE: Multidetector CT imaging of the chest was performed following the standard protocol without IV contrast. COMPARISON:  Radiographs earlier this day.   Chest CT 07/29/2012 FINDINGS: Cardiovascular: Aneurysmal dilatation of the ascending aorta measuring 5.4  cm, previously 5.0 cm. No periaortic stranding. There are aortic valvular calcifications and mitral annulus calcifications. Multi chamber cardiomegaly. Coronary artery calcifications. Small pericardial effusion with fluid in the pericardial recesses. Mediastinum/Nodes: No definite mediastinal adenopathy. No evidence of hilar adenopathy, limited assessment given lack of IV contrast. Heterogeneous thyroid gland without dominant nodule. Mildly patulous mid esophagus. Lungs/Pleura: Moderate to large right and moderate left pleural effusion. Adjacent airspace opacities typical of compressive atelectasis. Mild apical predominant paraseptal emphysema. No pulmonary mass or dominant nodule. The previous left lower lobe pulmonary nodule is obscured by atelectasis. Unchanged pleural based calcified density in the right lower hemithorax measuring approximately 2.1 cm. Trachea and proximal bronchi remain patent. Upper Abdomen: Atherosclerosis of upper abdominal vasculature. There is parenchymal atrophy of the pancreas. Musculoskeletal: There are no acute or suspicious osseous abnormalities. Mild degenerative change in the spine. Degenerative change in both shoulders, left greater than right. IMPRESSION: 1. Moderate to large right and moderate left pleural effusion. Adjacent dependent opacities are typical of compressive atelectasis. 2. 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 Recommend semi-annual imaging followup by CTA or MRA and referral to cardiothoracic surgery if not already obtained. This recommendation follows 2010 ACCF/AHA/AATS/ACR/ASA/SCA/SCAI/SIR/STS/SVM Guidelines for the Diagnosis and Management of Patients With Thoracic Aortic Disease. Circulation. 2010; 121: Z610-R604 3. Aortic atherosclerosis and coronary artery calcifications. Aortic valvular calcifications and mitral annulus calcifications. Mild  multi chamber cardiomegaly. 4. Mild emphysema. 5. Unchanged right pleural calcified density. Aortic Atherosclerosis (ICD10-I70.0) and Emphysema (ICD10-J43.9). Aortic Atherosclerosis (ICD10-I70.0). Electronically Signed   By: Rubye Oaks M.D.   On: 05/17/2017 05:26   US Renal  Result Date: 05/17/2017 CLINICAL DATA:  Acute renal failure EXAM: RENAL / URINARY TRACT ULTRASOUND COMPLETE COMPARISON:  05/17/2017 FINDINGS: Right Kidney: Length: 9.0 cm. Echogenicity within normal limits. No mass or hydronephrosis visualized. Left Kidney: Length: 9.5 cm. Echogenicity within normal limits. No mass or hydronephrosis visualized. Bladder: Appears normal for degree of bladder distention. Right pleural effusion noted. IMPRESSION: No acute finding by renal ultrasound. Right pleural effusion Electronically Signed   By: Judie Petit.  Shick M.D.   On: 05/17/2017 10:18     ASSESSMENT AND PLAN:   77 year old male with PAF and EtOH abuse who presents with generalized weakness.  1.  Generalized weakness due to influenza A Continue Tamiflu for 5-day course PT consult requested  2.  Acute kidney injury in the setting of poor p.o. intake due to influenza Renal ultrasound showed no evidence of hydronephrosis  3.  Acute hypoxic respiratory failure with bilateral pleural effusion: Plan for thoracentesis today Incentive spirometer for atelectasis Follow-up on echocardiogram   4.  Elevated troponin: Due to demand ischemia Echocardiogram and cardiology consultation  5.  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  EtOH abuse: CIWA protocol  8. Tobacco dependence: Patient is encouraged to quit smoking. Counseling was provided for 4 minutes.  9.  VWU:[id number] Metoprolol and Eliquis (to start after procedure)\  Management plans discussed with the patient and he is in agreement.  CODE STATUS: FULL  TOTAL TIME TAKING CARE OF THIS PATIENT:  30 minutes.     POSSIBLE D/C 2 days, DEPENDING ON CLINICAL CONDITION.   Gabryela Kimbrell M.D on 05/17/2017 at 10:43 AM  Between 7am to 6pm - Pager - [phone] After 6pm go to [email or web address] - password [employer]  [facility]  Office  [phone]  CC: Primary care physician; Lamar Blinks, MD  Note: This dictation was prepared with  Dragon dictation along with smaller Company secretary. Any transcriptional errors that result from this process are unintentional.

## 2017-05-18 ENCOUNTER — Inpatient Hospital Stay: Payer: Medicare Other

## 2017-05-18 DIAGNOSIS — R531 Weakness: Secondary | ICD-10-CM | POA: Diagnosis not present

## 2017-05-18 DIAGNOSIS — J101 Influenza due to other identified influenza virus with other respiratory manifestations: Secondary | ICD-10-CM | POA: Diagnosis not present

## 2017-05-18 DIAGNOSIS — I350 Nonrheumatic aortic (valve) stenosis: Secondary | ICD-10-CM

## 2017-05-18 LAB — CBC
HEMATOCRIT: 50 % (ref 40.0–52.0)
Hemoglobin: 16.1 g/dL (ref 13.0–18.0)
MCH: 27 pg (ref 26.0–34.0)
MCHC: 32.1 g/dL (ref 32.0–36.0)
MCV: 84.2 fL (ref 80.0–100.0)
PLATELETS: 251 10*3/uL (ref 150–440)
RBC: 5.94 MIL/uL — ABNORMAL HIGH (ref 4.40–5.90)
RDW: 21.7 % — AB (ref 11.5–14.5)
WBC: 7.2 10*3/uL (ref 3.8–10.6)

## 2017-05-18 LAB — BASIC METABOLIC PANEL
Anion gap: 7 (ref 5–15)
BUN: 44 mg/dL — AB (ref 6–20)
CHLORIDE: 102 mmol/L (ref 101–111)
CO2: 25 mmol/L (ref 22–32)
Calcium: 9.3 mg/dL (ref 8.9–10.3)
Creatinine, Ser: 1.52 mg/dL — ABNORMAL HIGH (ref 0.61–1.24)
GFR calc Af Amer: 50 mL/min — ABNORMAL LOW (ref >60)
GFR calc non Af Amer: 43 mL/min — ABNORMAL LOW (ref >60)
GLUCOSE: 111 mg/dL — AB (ref 65–99)
POTASSIUM: 4.3 mmol/L (ref 3.5–5.1)
Sodium: 134 mmol/L — ABNORMAL LOW (ref 135–145)

## 2017-05-18 LAB — MAGNESIUM: Magnesium: 1.9 mg/dL (ref 1.7–2.4)

## 2017-05-18 LAB — PHOSPHORUS: Phosphorus: 3.2 mg/dL (ref 2.5–4.6)

## 2017-05-18 MED ORDER — NICOTINE 14 MG/24HR TD PT24: 14.0000 mg | MEDICATED_PATCH | Freq: Every day | TRANSDERMAL | 0 refills | Status: DC

## 2017-05-18 MED ORDER — NITROGLYCERIN 0.4 MG SL SUBL: 0.4000 mg | SUBLINGUAL_TABLET | SUBLINGUAL | 0 refills | Status: DC | PRN

## 2017-05-18 MED ORDER — ENSURE ENLIVE PO LIQD: 237.0000 mL | Freq: Three times a day (TID) | ORAL | 12 refills | Status: DC

## 2017-05-18 MED ORDER — OSELTAMIVIR PHOSPHATE 30 MG PO CAPS: 30.0000 mg | ORAL_CAPSULE | Freq: Two times a day (BID) | ORAL | 0 refills | Status: AC

## 2017-05-18 MED ORDER — FUROSEMIDE 20 MG PO TABS
20.0000 mg | ORAL_TABLET | Freq: Every day | ORAL | Status: DC
  Administered 2017-05-18: 20 mg via ORAL
  Filled 2017-05-18: qty 1

## 2017-05-18 NOTE — Procedures (Signed)
Pre procedural Dx: Symptomatic Pleural effusion Post procedural Dx: Same  Successful US guided right sided thoracentesis yielding 1.2 L of serous pleural fluid.   Samples sent to lab for analysis.  EBL: None  Complications: None immediate.  Jay Summer Mccolgan, MD Pager #: [phone]   

## 2017-05-18 NOTE — Care Management Note (Signed)
Case Management Note  Patient Details  Name: Austin Zamora MRN: [id number] Date of Birth: 09/30/1940   Patient to discharge home today.  Lives at home with [facility].  PCP Sifferman.  [facility]  Patient denies issues with transportation or obtaining his medications.  PT has assessed patient and recommends home health and RW.  Patient agreeable to home health services, and does not have a preference of agency.  [facility].   RW to be delivered to room prior to discharge.  RNCM signing off.   Subjective/Objective:                    Action/Plan:   Expected Discharge Date:  05/18/17               Expected Discharge Plan:  Home w Home Health Services  In-House Referral:     Discharge planning Services  CM Consult  Post Acute Care Choice:  Durable Medical Equipment, Home Health Choice offered to:  Patient  DME Arranged:  [place] rolling DME Agency:  [facility]  HH Arr[facility]  [facility]  Status of Service:  Completed, signed off  If discussed at Long Length of Stay Meetings, dates discussed:    Additional Comments:  Chapman FitchBOWEN, Analina Filla T, RN 05/18/2017, 2:41 PM

## 2017-05-18 NOTE — Progress Notes (Signed)
Patient ID: Austin Zamora, male   DOB: 09/29/1940, 77 y.o.   MRN: [id number]  Chief Complaint  Patient presents with  . Generalized Body Aches    Referred By Dr. Mitzi HansenMoody Reason for Referral as ascending aortic aneurysm  HPI Location, Quality, Duration, Severity, Timing, Context, Modifying Factors, Associated Signs and Symptoms.  Austin Zamora is a 77 y.o. male.  This 77 year old gentleman was admitted to the hospital with a diagnosis of the flu.  He has a poor historian but it sounds as if he was having increasing shortness of breath.  However that may or may not be absolutely true as it was difficult for him to elucidate his exact reasons for bringing him to the hospital.  He does give a remote history of some peripheral edema and a family member who was diagnosed with the flu.  He states he received some vaccination but not the one for which she was admitted to the hospital for.  In any event upon presentation here he was found to have a right-sided pleural effusion and underwent a thoracentesis of 1.2 L of serous fluid.  Those results are still pending.  He also had a CT scan showing a severely calcified mitral and aortic valves with a fusiform enlargement of the ascending aorta measuring 5.4 cm.  An echocardiogram revealed severe aortic stenosis with mild to moderate mitral and aortic insufficiency.  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  His current functional status is somewhat limited and it is difficult to get an accurate picture of his overall assessment.  He states that he smokes 2 packs cigarettes a day and has done so for the last 50-60 years.  He also drinks several quarts of beer per day.  Both of these activities have recently stopped.  He also states that his daughter is recently deceased within the last week.  This has resulted in significant limitations in his current lifestyle.  He does see Dr. Gwen PoundsKowalski in cardiology and has seen  him in the past.   History reviewed. No pertinent past medical history.  History reviewed. No pertinent surgical history.   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  There is no family history of heart or lung disease  Social History Social History   Tobacco Use  . Smoking status: Current Every Day Smoker    Packs/day: 1.00    Types: Cigarettes  . Smokeless tobacco: Never Used  Substance Use Topics  . Alcohol use: Yes  . Drug use: No    No Known Allergies  Current Facility-Administered Medications  Medication Dose Route Frequency Provider Last Rate Last Dose  . acetaminophen (TYLENOL) tablet 650 mg  650 mg Oral Q6H PRN Salary, Montell D, MD       Or  . acetaminophen (TYLENOL) suppository 650 mg  650 mg Rectal Q6H PRN Salary, Montell D, MD      . alum & mag hydroxide-simeth (MAALOX/MYLANTA) 200-200-20 MG/5ML suspension 30 mL  30 mL Oral Q4H PRN Adrian SaranMody, Sital, MD   30 mL at 05/17/17 2337  . apixaban (ELIQUIS) tablet 5 mg  5 mg Oral BID Adrian SaranMody, Sital, MD   5 mg at 05/18/17 1227  . aspirin EC tablet 325 mg  325 mg Oral Daily Salary, Montell D, MD   325 mg at 05/18/17 1222  .  budesonide (PULMICORT) nebulizer solution 0.5 mg  0.5 mg Nebulization BID Salary, Montell D, MD   0.5 mg at 05/18/17 0737  . feeding supplement (ENSURE ENLIVE) (ENSURE ENLIVE) liquid 237 mL  237 mL Oral TID BM Mody, Sital, MD      . folic acid (FOLVITE) tablet 1 mg  1 mg Oral Daily Salary, Montell D, MD   1 mg at 05/18/17 1222  . furosemide (LASIX) tablet 20 mg  20 mg Oral Daily Caroleen Hamman, FNP   20 mg at 05/18/17 1227  . guaiFENesin (MUCINEX) 12 hr tablet 600 mg  600 mg Oral BID Salary, Montell D, MD   600 mg at 05/18/17 1002  . HYDROcodone-acetaminophen (NORCO/VICODIN) 5-325 MG per tablet 1-2 tablet  1-2 tablet Oral Q4H PRN Salary, Evelena Asa, MD   2 tablet at  05/17/17 1255  . ipratropium-albuterol (DUONEB) 0.5-2.5 (3) MG/3ML nebulizer solution 3 mL  3 mL Nebulization Q4H PRN Salary, Montell D, MD      . LORazepam (ATIVAN) tablet 1 mg  1 mg Oral Q6H PRN Salary, Evelena Asa, MD       Or  . LORazepam (ATIVAN) injection 1 mg  1 mg Intravenous Q6H PRN Salary, Montell D, MD      . metoprolol succinate (TOPROL-XL) 24 hr tablet 50 mg  50 mg Oral Daily Mody, Sital, MD   50 mg at 05/17/17 1242  . morphine 2 MG/ML injection 2 mg  2 mg Intravenous Q2H PRN Salary, Montell D, MD      . multivitamin with minerals tablet 1 tablet  1 tablet Oral Daily Salary, Montell D, MD   1 tablet at 05/18/17 1221  . nicotine (NICODERM CQ - dosed in mg/24 hours) patch 14 mg  14 mg Transdermal Daily Salary, Montell D, MD   14 mg at 05/18/17 1227  . nitroGLYCERIN (NITROSTAT) SL tablet 0.4 mg  0.4 mg Sublingual Q5 min PRN Salary, Montell D, MD      . ondansetron (ZOFRAN) tablet 4 mg  4 mg Oral Q6H PRN Salary, Montell D, MD       Or  . ondansetron (ZOFRAN) injection 4 mg  4 mg Intravenous Q6H PRN Salary, Montell D, MD      . oseltamivir (TAMIFLU) capsule 30 mg  30 mg Oral BID Salary, Montell D, MD   30 mg at 05/18/17 1237  . thiamine (VITAMIN B-1) tablet 100 mg  100 mg Oral Daily Salary, Montell D, MD   100 mg at 05/18/17 1227   Or  . thiamine (B-1) injection 100 mg  100 mg Intravenous Daily Salary, Montell D, MD          Review of Systems A complete review of systems was asked and was negative except for the following positive findings increasing shortness of breath, lower extremity pain,  Blood pressure 126/88, pulse 87, temperature (!) 97.2 F (36.2 C), temperature source Oral, resp. rate 20, height 5\' 8"  (1.727 m), weight 149 lb (67.6 kg), SpO2 96 %.  Physical Exam CONSTITUTIONAL:  Pleasant, well-developed, well-nourished, and in no acute distress. EYES: Pupils equal and reactive to light, Sclera non-icteric EARS, NOSE, MOUTH AND THROAT:  The oropharynx was clear.   Dentition is good repair.  Oral mucosa pink and moist. LYMPH NODES:  Lymph nodes in the neck and axillae were normal RESPIRATORY:  Lungs were clear but they were diminished at the right base..  Normal respiratory effort without pathologic use of accessory muscles of respiration CARDIOVASCULAR: Heart was regular  without murmurs.  There were no carotid bruits.  No peripheral pulses were palpable there was trace pedal edema GI: The abdomen was soft, nontender, and nondistended. There were no palpable masses. There was no hepatosplenomegaly. There were normal bowel sounds in all quadrants. GU:  Rectal deferred.   MUSCULOSKELETAL:  Normal muscle strength and tone.  No clubbing or cyanosis.   SKIN:  There were no pathologic skin lesions.  There were no nodules on palpation. PSYCH:  Oriented to person, place and time.  Mood and affect are normal.  He appeared to have a very poor insight into his current medical condition  Data Reviewed CT scan and chest x-rays  I have personally reviewed the patient's imaging, laboratory findings and medical records.    Assessment    I have independently reviewed the patient's CT scan.  There is a 5.4 cm ascending aortic aneurysm presumably secondary to his aortic stenosis.  There is significant calcification of the aortic and mitral valves.  There is a pleural effusion which has been tapped.    Plan    Given the extent of his aortic valve disease he may be a candidate to be evaluated for valve replacement.  At the present time he is being evaluated by her cardiologist.  I explained to him that this type of procedure may involve replacement of the valve and the ascending aorta.  I would continue to follow him for now and would be happy to see him on an outpatient basis.  Obviously this procedure could not be performed here but would be performed at a tertiary referral center.       Hulda Marin, MD 05/18/2017, 12:43 PM

## 2017-05-18 NOTE — Discharge Summary (Signed)
[employer] [facility] [facility] [place] [facility]   PATIENT NAME: Austin Zamora    MR#:  [id number]  DATE OF BIRTH:  1940-03-23  DATE OF ADMISSION:  05/17/2017 ADMITTING PHYSICIAN: Bertrum Sol, MD  DATE OF DISCHARGE: 05/18/2017  PRIMARY CARE PHYSICIAN: Lamar Blinks, MD    ADMISSION DIAGNOSIS:  Dehydration [E86.0] Lactic acidosis [E87.2] SOB (shortness of breath) [R06.02] Influenza A [J10.1] Acute kidney injury (HCC) [N17.9]  DISCHARGE DIAGNOSIS:  Active Problems:   Influenza A   Protein-calorie malnutrition, severe   SECONDARY DIAGNOSIS:  PAF   HOSPITAL COURSE:   77 year old male with PAF and EtOH abuse who presents with generalized weakness.  1.  Generalized weakness due to influenza A He will continue Tamiflu for 5-day course   2.  Acute kidney injury in the setting of poor p.o. intake due to influenza Renal ultrasound showed no evidence of hydronephrosis His creatinine has improved  3.  Acute hypoxic respiratory failure with bilateral pleural effusion:  Patient underwent thoracentesis with Successful ultrasound-guided right sided thoracentesis yielding 1.2 liters of pleural fluid. Echocardiogram shows 4 chamber dilatation with severe LV systolic dysfunction and diffuse hypokinesis and severe Aortic stenosis and mild AR. Moderate MR.  EF is 20% Patient is on lisinopril and metoprolol.  Patient will need close follow-up with cardiologist.  Patient was developed by cardiology team while in the hospital.  Patient in the future will likely require surgery for the mitral and aortic valve. He is referred to CHF clinic upon discharge.  He had no signs or exacerbations of CHF during his hospital stay.  4.  Elevated troponin: Patient was eval by cardiology.  Elevation in troponin is due to demand ischemia from influenza a.  He has been ruled out for ACS.  5.    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 Thelma Barge. It appears that the thoracic aortic aneurysm is actually increased in size.  He will need surgery in the future for this.  He will also need aortic and mitral valve surgery.  Patient will have close follow-up with Dr. Thelma Barge this week.    7.  EtOH abuse: Patient had uneventful detox   8. Tobacco dependence: Patient is encouraged to quit smoking. Counseling was provided for 4 minutes.  9.  WUJ:WJXBJYNW Metoprolol and Eliquis  He will follow-up with Dr. Gwen Pounds    DISCHARGE CONDITIONS AND DIET:   Stable for discharge on heart healthy cardiac diet  CONSULTS OBTAINED:  Treatment Team:  Lamont Dowdy, MD Lamar Blinks, MD Laurier Nancy, MD  DRUG ALLERGIES:  No Known Allergies  DISCHARGE MEDICATIONS:   Allergies as of 05/18/2017   No Known Allergies     Medication List    TAKE these medications   ELIQUIS 5 MG Tabs tablet Generic drug:  apixaban Take 5 mg by mouth 2 (two) times daily.   feeding supplement (ENSURE ENLIVE) Liqd Take 237 mLs by mouth 3 (three) times daily between meals.   furosemide 20 MG tablet Commonly known as:  LASIX Take 20 mg by mouth daily.   lisinopril 20 MG tablet Commonly known as:  PRINIVIL,ZESTRIL Take 20 mg by mouth daily.   metoprolol succinate 50 MG 24 hr tablet Commonly known as:  TOPROL-XL Take 50 mg by mouth daily.   nicotine 14 mg/24hr patch Commonly known as:  NICODERM CQ - dosed in mg/24 hours Place 1 patch (14 mg  total) onto the skin daily.   nitroGLYCERIN 0.4 MG SL tablet Commonly known as:  NITROSTAT Place 1 tablet (0.4 mg total) under the tongue every 5 (five) minutes as needed for chest pain.   oseltamivir 30 MG capsule Commonly known as:  TAMIFLU Take 1 capsule (30 mg total) by mouth 2 (two) times daily for 3 days.   PROAIR HFA 108 (90 Base) MCG/ACT inhaler Generic drug:  albuterol         Today   CHIEF  COMPLAINT:  Patient doing well without shortness of breath or chest pain this morning   VITAL SIGNS:  Blood pressure 126/88, pulse 87, temperature (!) 97.2 F (36.2 C), temperature source Oral, resp. rate 20, height 5\' 8"  (1.727 m), weight 67.6 kg (149 lb), SpO2 96 %.   REVIEW OF SYSTEMS:  Review of Systems  Constitutional: Negative.  Negative for chills, fever and malaise/fatigue.  HENT: Negative.  Negative for ear discharge, ear pain, hearing loss, nosebleeds and sore throat.   Eyes: Negative.  Negative for blurred vision and pain.  Respiratory: Negative.  Negative for cough, hemoptysis, shortness of breath and wheezing.   Cardiovascular: Negative.  Negative for chest pain, palpitations and leg swelling.  Gastrointestinal: Negative.  Negative for abdominal pain, blood in stool, diarrhea, nausea and vomiting.  Genitourinary: Negative.  Negative for dysuria.  Musculoskeletal: Negative.  Negative for back pain.  Skin: Negative.   Neurological: Negative for dizziness, tremors, speech change, focal weakness, seizures and headaches.  Endo/Heme/Allergies: Negative.  Does not bruise/bleed easily.  Psychiatric/Behavioral: Negative.  Negative for depression, hallucinations and suicidal ideas.     PHYSICAL EXAMINATION:   GENERAL:  77 y.o.-year-old patient lying in the bed with no acute distress.  Cachectic NECK:  Supple, no jugular venous distention. No thyroid enlargement, no tenderness.  LUNGS: Normal breath sounds bilaterally, no wheezing, rales,rhonchi  No use of accessory muscles of respiration.  CARDIOVASCULAR: Irregular, irregular. 3/6 holosystolic murmur NO, rubs, or gallops.  ABDOMEN: Soft, non-tender, non-distended. Bowel sounds present. No organomegaly or mass.  EXTREMITIES: No pedal edema, cyanosis, or clubbing.  PSYCHIATRIC: The patient is alert and oriented x 3.  SKIN: No obvious rash, lesion, or ulcer.   DATA REVIEW:   CBC Recent Labs  Lab 05/18/17 0436  WBC 7.2   HGB 16.1  HCT 50.0  PLT 251    Chemistries  Recent Labs  Lab 05/17/17 0410 05/18/17 0436  NA 134* 134*  K 4.1 4.3  CL 100* 102  CO2 23 25  GLUCOSE 97 111*  BUN 45* 44*  CREATININE 1.94* 1.52*  CALCIUM 9.7 9.3  MG  --  1.9  AST 47*  --   ALT 29  --   ALKPHOS 98  --   BILITOT 1.8*  --     Cardiac Enzymes Recent Labs  Lab 05/17/17 0708 05/17/17 1244 05/17/17 1836  TROPONINI 0.06* 0.04* 0.04*    Microbiology Results  @[email or web address] @  RADIOLOGY:  Dg Chest 2 View  Result Date: 05/17/2017 CLINICAL DATA:  Shortness of breath.  Flu. EXAM: CHEST - 2 VIEW COMPARISON:  Chest CT 07/29/2012 FINDINGS: The heart is enlarged. There is atherosclerosis of the thoracic aorta. Moderate right and small left pleural effusion with associated bibasilar opacity. No definite pulmonary edema. Right lung base calcification is pleural on prior CT. Upper lungs are clear. No pneumothorax. Chronic degenerative change of both shoulders. Flaring of the distal right clavicle may be sequela of remote prior injury. IMPRESSION: 1. Bilateral pleural effusions, moderate on  the right and small on the left with adjacent basilar opacities, likely compressive atelectasis. 2. Cardiomegaly. Electronically Signed   By: Rubye OaksMelanie  Ehinger M.D.   On: 05/17/2017 03:48   Ct Chest Wo Contrast  Result Date: 05/17/2017 CLINICAL DATA:  Acute resp illness, > 76 years old. Generalized weakness for 2 weeks. Weight loss. EXAM: CT CHEST WITHOUT CONTRAST TECHNIQUE: Multidetector CT imaging of the chest was performed following the standard protocol without IV contrast. COMPARISON:  Radiographs earlier this day.  Chest CT 07/29/2012 FINDINGS: Cardiovascular: Aneurysmal dilatation of the ascending aorta measuring 5.4 cm, previously 5.0 cm. No periaortic stranding. There are aortic valvular calcifications and mitral annulus calcifications. Multi chamber cardiomegaly. Coronary artery calcifications. Small pericardial effusion with fluid in  the pericardial recesses. Mediastinum/Nodes: No definite mediastinal adenopathy. No evidence of hilar adenopathy, limited assessment given lack of IV contrast. Heterogeneous thyroid gland without dominant nodule. Mildly patulous mid esophagus. Lungs/Pleura: Moderate to large right and moderate left pleural effusion. Adjacent airspace opacities typical of compressive atelectasis. Mild apical predominant paraseptal emphysema. No pulmonary mass or dominant nodule. The previous left lower lobe pulmonary nodule is obscured by atelectasis. Unchanged pleural based calcified density in the right lower hemithorax measuring approximately 2.1 cm. Trachea and proximal bronchi remain patent. Upper Abdomen: Atherosclerosis of upper abdominal vasculature. There is parenchymal atrophy of the pancreas. Musculoskeletal: There are no acute or suspicious osseous abnormalities. Mild degenerative change in the spine. Degenerative change in both shoulders, left greater than right. IMPRESSION: 1. Moderate to large right and moderate left pleural effusion. Adjacent dependent opacities are typical of compressive atelectasis. 2. 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 Recommend semi-annual imaging followup by CTA or MRA and referral to cardiothoracic surgery if not already obtained. This recommendation follows 2010 ACCF/AHA/AATS/ACR/ASA/SCA/SCAI/SIR/STS/SVM Guidelines for the Diagnosis and Management of Patients With Thoracic Aortic Disease. Circulation. 2010; 121: [id number] 3. Aortic atherosclerosis and coronary artery calcifications. Aortic valvular calcifications and mitral annulus calcifications. Mild multi chamber cardiomegaly. 4. Mild emphysema. 5. Unchanged right pleural calcified density. Aortic Atherosclerosis (ICD10-I70.0) and Emphysema (ICD10-J43.9). Aortic Atherosclerosis (ICD10-I70.0). Electronically Signed   By: Rubye OaksMelanie  Ehinger M.D.   On: 05/17/2017 05:26   [place] Renal  Result Date: 05/17/2017 CLINICAL DATA:   Acute renal failure EXAM: RENAL / URINARY TRACT ULTRASOUND COMPLETE COMPARISON:  05/17/2017 FINDINGS: Right Kidney: Length: 9.0 cm. Echogenicity within normal limits. No mass or hydronephrosis visualized. Left Kidney: Length: 9.5 cm. Echogenicity within normal limits. No mass or hydronephrosis visualized. Bladder: Appears normal for degree of bladder distention. Right pleural effusion noted. IMPRESSION: No acute finding by renal ultrasound. Right pleural effusion Electronically Signed   By: Judie PetitM.  Shick M.D.   On: 05/17/2017 10:18   Dg Chest Port 1 View  Result Date: 05/18/2017 CLINICAL DATA:  Status post thoracentesis. EXAM: PORTABLE CHEST 1 VIEW COMPARISON:  05/17/2016. FINDINGS: Cardiac enlargement persists.  Thoracic atherosclerosis. Large RIGHT pleural effusion was demonstrated on CT. This appears decreased on the current radiograph. No visible pneumothorax. Small RIGHT effusion remains. IMPRESSION: Satisfactory post thoracentesis appearance with decreased effusion on the RIGHT and no pneumothorax. Electronically Signed   By: Elsie StainJohn T Curnes M.D.   On: 05/18/2017 11:14   [place] Thoracentesis Asp Pleural Space W/img Guide  Result Date: 05/18/2017 INDICATION: Symptomatic right-sided pleural effusion. Please from ultrasound-guided thoracentesis for therapeutic and diagnostic purposes. EXAM: US THORACENTESIS ASP PLEURAL SPACE W/IMG GUIDE COMPARISON:  Chest CT - 05/17/2017; chest radiograph - 05/17/2017 MEDICATIONS: None. COMPLICATIONS: None immediate. TECHNIQUE: Informed written consent was obtained from the patient  after a discussion of the risks, benefits and alternatives to treatment. A timeout was performed prior to the initiation of the procedure. Initial ultrasound scanning demonstrates a moderate to large size right-sided anechoic pleural effusion. The lower chest was prepped and draped in the usual sterile fashion. 1% lidocaine was used for local anesthesia. An ultrasound image was saved for documentation  purposes. An 8 Fr Safe-T-Centesis catheter was introduced. The thoracentesis was performed. The catheter was removed and a dressing was applied. The patient tolerated the procedure well without immediate post procedural complication. The patient was escorted to have an upright chest radiograph. FINDINGS: A total of approximately 1.2 liters of serous fluid was removed. Requested samples were sent to the laboratory. IMPRESSION: Successful ultrasound-guided right sided thoracentesis yielding 1.2 liters of pleural fluid. Electronically Signed   By: Simonne Come M.D.   On: 05/18/2017 11:18      Allergies as of 05/18/2017   No Known Allergies     Medication List    TAKE these medications   ELIQUIS 5 MG Tabs tablet Generic drug:  apixaban Take 5 mg by mouth 2 (two) times daily.   feeding supplement (ENSURE ENLIVE) Liqd Take 237 mLs by mouth 3 (three) times daily between meals.   furosemide 20 MG tablet Commonly known as:  LASIX Take 20 mg by mouth daily.   lisinopril 20 MG tablet Commonly known as:  PRINIVIL,ZESTRIL Take 20 mg by mouth daily.   metoprolol succinate 50 MG 24 hr tablet Commonly known as:  TOPROL-XL Take 50 mg by mouth daily.   nicotine 14 mg/24hr patch Commonly known as:  NICODERM CQ - dosed in mg/24 hours Place 1 patch (14 mg total) onto the skin daily.   nitroGLYCERIN 0.4 MG SL tablet Commonly known as:  NITROSTAT Place 1 tablet (0.4 mg total) under the tongue every 5 (five) minutes as needed for chest pain.   oseltamivir 30 MG capsule Commonly known as:  TAMIFLU Take 1 capsule (30 mg total) by mouth 2 (two) times daily for 3 days.   PROAIR HFA 108 (90 Base) MCG/ACT inhaler Generic drug:  albuterol          Management plans discussed with the patient and he is in agreement. Stable for discharge   Patient should follow up with cardiology and Dr Thelma Barge  CODE STATUS:     Code Status Orders  (From admission, onward)        Start     Ordered    05/17/17 0645  Full code  Continuous     05/17/17 0644    Code Status History    Date Active Date Inactive Code Status Order ID Comments User Context   09/06/2014 04:29 09/06/2014 16:20 Full Code [id number]  Arnaldo Natal, MD Inpatient      TOTAL TIME TAKING CARE OF THIS PATIENT: 39 minutes.    Note: This dictation was prepared with Dragon dictation along with smaller phrase technology. Any transcriptional errors that result from this process are unintentional.  Kolston Lacount M.D on 05/18/2017 at 11:22 AM  Between 7am to 6pm - Pager - [phone] After 6pm go to [email or web address] - password [email or web address]  [facility]  Office  [phone]  CC: Primary care physician; Lamar Blinks, MD

## 2017-05-18 NOTE — Progress Notes (Signed)
PT Cancellation Note  Patient Details Name: Austin Zamora MRN: [id number] DOB: 10/07/1940   Cancelled Treatment:    Reason Eval/Treat Not Completed: Other (comment). Received consult, however pt pending cardiothoracic SX for AAA >5cm. Will hold therapy until medically cleared for participation.   [name] 05/18/2017, 8:34 AM Elizabeth PalauStephanie Kristjan Derner, PT, DPT [phone]

## 2017-05-18 NOTE — Care Management Important Message (Signed)
Important Message  Patient Details  Name: Eliberto IvoryHarry C Fugitt MRN: [id number] Date of Birth: 08/08/1940   Medicare Important Message Given:  N/A - LOS <3 / Initial given by admissions    Chapman FitchBOWEN, Jule Whitsel T, RN 05/18/2017, 1:57 PM

## 2017-05-18 NOTE — Evaluation (Signed)
Physical Therapy Evaluation Patient Details Name: Austin Zamora MRN: [id number] DOB: 10/26/1940 Today's Date: 05/18/2017   History of Present Illness  Pt admitted for flu with complaints of weakness x 2 weeks. History includes afib and alcohol abuse. Pt with AAA increasing in size, however will plan to follow up with MD on outpatient basis. Of note, pt is poor historian. Had R side thoracentesis this date.  Clinical Impression  Pt is a pleasant 77 year old male who was admitted for flu. Pt performs bed mobility/transfers with independence and ambulation with min assist without AD progressing to cga with RW. Pt demonstrates deficits with balance/mobility/safety. Pt is poor historian, however at baseline reports he does not use AD. Recommend him using RW for all mobility at this time secondary to falls risk. Pt agreeable. Would benefit from skilled PT to address above deficits and promote optimal return to PLOF. Recommend transition to HHPT upon discharge from acute hospitalization.       Follow Up Recommendations Home health PT    Equipment Recommendations  Rolling walker with 5" wheels    Recommendations for Other Services       Precautions / Restrictions Precautions Precautions: Fall Restrictions Weight Bearing Restrictions: No      Mobility  Bed Mobility Overal bed mobility: Independent             General bed mobility comments: Pt seated at EOB upon arrival. At end of session pt able to go from sit->supine with safe technique and independence  Transfers Overall transfer level: Independent Equipment used: None             General transfer comment: upright posture noted with ease of transition   Ambulation/Gait Ambulation/Gait assistance: Min assist Ambulation Distance (Feet): 40 Feet Assistive device: None Gait Pattern/deviations: Step-to pattern;Staggering left     General Gait Details: ambulated to door and back due to isolation precautions. Pt with LOB  towards L side during mobility. Needed hands on assist to prevent fall. No fatigue noted. Further ambulation performed in ther-ex  Stairs            Wheelchair Mobility    Modified Rankin (Stroke Patients Only)       Balance Overall balance assessment: History of Falls;Needs assistance Sitting-balance support: Feet supported Sitting balance-Leahy Scale: Good     Standing balance support: No upper extremity supported Standing balance-Leahy Scale: Fair                               Pertinent Vitals/Pain Pain Assessment: No/denies pain    Home Living Family/patient expects to be discharged to:: Private residence Living Arrangements: Alone Available Help at Discharge: Family;Available PRN/intermittently(son is in town and can help out as needed) Type of Home: House Home Access: Stairs to enter   [employer] of Steps: 1 step in back without rails, 10 steps in front with railings Home Layout: One level Home Equipment: Cane - single point      Prior Function Level of Independence: Independent         Comments: reports recent fall last week due to weakness     Hand Dominance        Extremity/Trunk Assessment   Upper Extremity Assessment Upper Extremity Assessment: Overall WFL for tasks assessed    Lower Extremity Assessment Lower Extremity Assessment: Generalized weakness(B LE grossly 4/5)       Communication   Communication: No difficulties  Cognition Arousal/Alertness: Awake/alert  Behavior During Therapy: WFL for tasks assessed/performed Overall Cognitive Status: History of cognitive impairments - at baseline                                        General Comments      Exercises Other Exercises Other Exercises: Once standing initally, performed standing alt marching with B UE support on counter surface x 10 reps. Pt then ambulated additional 5240' with RW and cga. Improved balance noted and no LOB. All  mobility performed on room air with sats at 93%.   Assessment/Plan    PT Assessment Patient needs continued PT services  PT Problem List Decreased strength;Decreased balance;Decreased mobility;Decreased safety awareness       PT Treatment Interventions Gait training;Therapeutic exercise;Balance training    PT Goals (Current goals can be found in the Care Plan section)  Acute Rehab PT Goals Patient Stated Goal: to get stronger PT Goal Formulation: With patient Time For Goal Achievement: 06/01/17 Potential to Achieve Goals: Good    Frequency Min 2X/week   Barriers to discharge        Co-evaluation               AM-PAC PT "6 Clicks" Daily Activity  Outcome Measure Difficulty turning over in bed (including adjusting bedclothes, sheets and blankets)?: None Difficulty moving from lying on back to sitting on the side of the bed? : None Difficulty sitting down on and standing up from a chair with arms (e.g., wheelchair, bedside commode, etc,.)?: None Help needed moving to and from a bed to chair (including a wheelchair)?: A Little Help needed walking in hospital room?: A Little Help needed climbing 3-5 steps with a railing? : A Little 6 Click Score: 21    End of Session Equipment Utilized During Treatment: Gait belt Activity Tolerance: Patient tolerated treatment well Patient left: in bed;with bed alarm set Nurse Communication: Mobility status PT Visit Diagnosis: Unsteadiness on feet (R26.81);Muscle weakness (generalized) (M62.81);History of falling (Z91.81);Difficulty in walking, not elsewhere classified (R26.2)    Time: [id number] PT Time Calculation (min) (ACUTE ONLY): 23 min   Charges:   PT Evaluation $PT Eval Low Complexity: 1 Low PT Treatments $Gait Training: 8-22 mins   PT G CodesElizabeth Palau:        Jahquez Steffler, PT, DPT [phone]   Laurin Paulo 05/18/2017, 3:05 PM

## 2017-05-18 NOTE — Progress Notes (Signed)
SUBJECTIVE: Feeling well no chest pain or shortness of breath currently.    Vitals:   05/17/17 2008 05/18/17 0026 05/18/17 0500 05/18/17 0622  BP:  112/81  108/71  Pulse:  90  87  Resp:  16  20  Temp:  (!) 97.3 F (36.3 C)  (!) 97.2 F (36.2 C)  TempSrc:  Oral  Oral  SpO2: 95% 93%  96%  Weight:   149 lb (67.6 kg)   Height:        Intake/Output Summary (Last 24 hours) at 05/18/2017 0858 Last data filed at 05/18/2017 0851 Gross per 24 hour  Intake 307.5 ml  Output 800 ml  Net -492.5 ml    LABS: Basic Metabolic Panel: Recent Labs    05/17/17 0410 05/18/17 0436  NA 134* 134*  K 4.1 4.3  CL 100* 102  CO2 23 25  GLUCOSE 97 111*  BUN 45* 44*  CREATININE 1.94* 1.52*  CALCIUM 9.7 9.3  MG  --  1.9  PHOS  --  3.2   Liver Function Tests: Recent Labs    05/17/17 0410  AST 47*  ALT 29  ALKPHOS 98  BILITOT 1.8*  PROT 7.0  ALBUMIN 3.2*   Recent Labs    05/17/17 0410  LIPASE 48   CBC: Recent Labs    05/17/17 0410 05/18/17 0436  WBC 7.0 7.2  NEUTROABS 4.4  --   HGB 16.7 16.1  HCT 52.3* 50.0  MCV 85.0 84.2  PLT 285 251   Cardiac Enzymes: Recent Labs    05/17/17 0708 05/17/17 1244 05/17/17 1836  TROPONINI 0.06* 0.04* 0.04*   BNP: Invalid input(s): POCBNP D-Dimer: No results for input(s): DDIMER in the last 72 hours. Hemoglobin A1C: No results for input(s): HGBA1C in the last 72 hours. Fasting Lipid Panel: Recent Labs    05/17/17 0708  CHOL 104  HDL 28*  LDLCALC 60  TRIG 79  CHOLHDL 3.7   Thyroid Function Tests: Recent Labs    05/17/17 0708  TSH 1.663   Anemia Panel: No results for input(s): VITAMINB12, FOLATE, FERRITIN, TIBC, IRON, RETICCTPCT in the last 72 hours.   PHYSICAL EXAM General: Well developed, well nourished, in no acute distress HEENT:  Normocephalic and atramatic Neck:  No JVD.  Lungs: Clear bilaterally to auscultation and percussion. Heart: HRRR . Normal S1 and S2 without gallops or murmurs.  Abdomen: Bowel sounds  are positive, abdomen soft and non-tender  Msk:  Back normal, normal gait. Normal strength and tone for age. Extremities: No clubbing, cyanosis or edema.   Neuro: Alert and oriented X 3. Psych:  Good affect, responds appropriately  TELEMETRY: Not available.  ASSESSMENT AND PLAN: Mild troponin elevation likely due to demand ischemia with chronic atrial fibrillation rate controlled with metoprolol and Eliquis for stroke prevention.  Newly diagnosed congestive heart failure with severely reduced left ventricular systolic function 20% and severe aortic stenosis. Blood pressure is borderline low, advise waiting until stablaized to start Entresto/ACE-I. Start lasix for volume control, continue metoprolol.  Advise close outpatient follow up with primary cardiologist.   Active Problems:   Influenza A   Protein-calorie malnutrition, severe    Caroleen HammanKristin Nadalie Laughner, NP-C 05/18/2017 8:58 AM Cell: [phone]

## 2017-05-19 LAB — CYTOLOGY - NON PAP

## 2017-05-19 LAB — CA 19-9 (SERIAL): CAN 19-9: 27 U/mL (ref 0–35)

## 2017-05-19 LAB — AFP TUMOR MARKER: AFP, Serum, Tumor Marker: 1.9 ng/mL (ref 0.0–8.3)

## 2017-05-19 LAB — CEA: CEA: 8 ng/mL — ABNORMAL HIGH (ref 0.0–4.7)

## 2017-05-21 ENCOUNTER — Inpatient Hospital Stay: Payer: Self-pay | Admitting: Cardiothoracic Surgery

## 2017-05-21 DIAGNOSIS — N179 Acute kidney failure, unspecified: Secondary | ICD-10-CM | POA: Insufficient documentation

## 2017-05-21 DIAGNOSIS — I350 Nonrheumatic aortic (valve) stenosis: Secondary | ICD-10-CM | POA: Insufficient documentation

## 2017-05-21 LAB — BODY FLUID CULTURE: Culture: NO GROWTH

## 2017-05-22 ENCOUNTER — Ambulatory Visit: Payer: Medicare Other | Admitting: Cardiothoracic Surgery

## 2017-05-22 ENCOUNTER — Telehealth: Payer: Self-pay

## 2017-05-22 ENCOUNTER — Encounter: Payer: Self-pay | Admitting: Cardiothoracic Surgery

## 2017-05-22 VITALS — BP 123/83 | HR 56 | Temp 97.8°F | Ht 69.0 in | Wt 149.0 lb

## 2017-05-22 DIAGNOSIS — I712 Thoracic aortic aneurysm, without rupture, unspecified: Secondary | ICD-10-CM

## 2017-05-22 NOTE — Progress Notes (Signed)
  Patient ID: Austin Zamora, male   DOB: 07/16/1940, 77 y.o.   MRN: [id number]  HISTORY: He returns today in follow-up.  He states that he continues to be short of breath.  He also continues to smoke and has heavy alcohol consumption.  He was able to walk in here today with the use of a walker.   Vitals:   05/22/17 1046  BP: 123/83  Pulse: (!) 56  Temp: 97.8 F (36.6 C)     EXAM:    Resp: Lungs are clear bilaterally.  No respiratory distress, normal effort. Heart:  Regular without murmurs Abd:  Abdomen is soft, non distended and non tender. No masses are palpable.  There is no rebound and no guarding.  Neurological: Alert and oriented to person, place, and time. Coordination normal.  Skin: Skin is warm and dry. No rash noted. No diaphoretic. No erythema. No pallor.  Psychiatric: Normal mood and affect. Normal behavior. Judgment and thought content normal.    ASSESSMENT: I had a long discussion with him and his son today.  I explained to them that the ascending aortic aneurysm now measures 5.4 cm whereas before it measured 5.0.  This extends up into the arch.  In the presence of severe left ventricular dysfunction and a aortic stenosis he is a high risk candidate.  We will make an appointment for him to see Dr. Gwen PoundsKowalski for his consideration of surgical intervention.   PLAN:   We will have the patient follow-up with [facility].    Hulda Marinimothy Micco Bourbeau, MD

## 2017-05-22 NOTE — Telephone Encounter (Signed)
Left message for patient to contact the office to make appointment per referral received from inpatient stay.

## 2017-05-22 NOTE — Telephone Encounter (Signed)
Called Dr. Philemon KingdomKowalski's office to schedule an appointment for the patient to be seen and I was told that he already had an appointment on 63/26/2019. I asked if they could please call the son with the appointment and they stated that they would.

## 2017-05-22 NOTE — Patient Instructions (Signed)
We will call your cardiologist (Dr. Gwen PoundsKowalski) and schedule you an appointment to be seen so they could discuss possible surgery.

## 2017-05-22 NOTE — Telephone Encounter (Signed)
-----   Message from Delma Freezeina A Hackney, [place] sent at 05/19/2017  9:07 AM EDT ----- Regarding: Please call to schedule Contact: [phone] Hospitalist made referral at discharge

## 2017-05-27 DIAGNOSIS — I712 Thoracic aortic aneurysm, without rupture, unspecified: Secondary | ICD-10-CM | POA: Diagnosis present

## 2017-05-27 DIAGNOSIS — I10 Essential (primary) hypertension: Secondary | ICD-10-CM | POA: Insufficient documentation

## 2017-05-27 HISTORY — DX: Thoracic aortic aneurysm, without rupture, unspecified: I71.20

## 2017-06-10 DIAGNOSIS — I5022 Chronic systolic (congestive) heart failure: Secondary | ICD-10-CM | POA: Diagnosis present

## 2017-06-10 NOTE — Telephone Encounter (Signed)
Second attempt to contact patient line busy.

## 2017-06-22 NOTE — Telephone Encounter (Signed)
Third attempt to contact patient. Left voicemail advising patient to contact office to make an appointment. We will wait to hear from the patient but no further attempts will be made.

## 2017-09-22 ENCOUNTER — Ambulatory Visit
Admission: RE | Admit: 2017-09-22 | Discharge: 2017-09-22 | Disposition: A | Discharge status: Home / Self Care | Payer: Medicare Other | Source: Ambulatory Visit | Attending: Internal Medicine | Admitting: Internal Medicine

## 2017-09-22 ENCOUNTER — Encounter
Admission: RE | Disposition: A | Discharge status: Home / Self Care | Payer: Self-pay | Source: Ambulatory Visit | Attending: Internal Medicine

## 2017-09-22 DIAGNOSIS — E785 Hyperlipidemia, unspecified: Secondary | ICD-10-CM | POA: Insufficient documentation

## 2017-09-22 DIAGNOSIS — I352 Nonrheumatic aortic (valve) stenosis with insufficiency: Secondary | ICD-10-CM | POA: Insufficient documentation

## 2017-09-22 DIAGNOSIS — I739 Peripheral vascular disease, unspecified: Secondary | ICD-10-CM | POA: Insufficient documentation

## 2017-09-22 DIAGNOSIS — I712 Thoracic aortic aneurysm, without rupture: Secondary | ICD-10-CM | POA: Diagnosis not present

## 2017-09-22 DIAGNOSIS — Z79899 Other long term (current) drug therapy: Secondary | ICD-10-CM | POA: Diagnosis not present

## 2017-09-22 DIAGNOSIS — I5022 Chronic systolic (congestive) heart failure: Secondary | ICD-10-CM | POA: Insufficient documentation

## 2017-09-22 DIAGNOSIS — Z7901 Long term (current) use of anticoagulants: Secondary | ICD-10-CM | POA: Insufficient documentation

## 2017-09-22 DIAGNOSIS — I110 Hypertensive heart disease with heart failure: Secondary | ICD-10-CM | POA: Insufficient documentation

## 2017-09-22 DIAGNOSIS — I272 Pulmonary hypertension, unspecified: Secondary | ICD-10-CM | POA: Insufficient documentation

## 2017-09-22 DIAGNOSIS — R0602 Shortness of breath: Secondary | ICD-10-CM | POA: Insufficient documentation

## 2017-09-22 DIAGNOSIS — F17210 Nicotine dependence, cigarettes, uncomplicated: Secondary | ICD-10-CM | POA: Insufficient documentation

## 2017-09-22 DIAGNOSIS — I350 Nonrheumatic aortic (valve) stenosis: Secondary | ICD-10-CM

## 2017-09-22 DIAGNOSIS — I482 Chronic atrial fibrillation: Secondary | ICD-10-CM | POA: Insufficient documentation

## 2017-09-22 HISTORY — PX: RIGHT/LEFT HEART CATH AND CORONARY ANGIOGRAPHY: CATH118266

## 2017-09-22 SURGERY — RIGHT/LEFT HEART CATH AND CORONARY ANGIOGRAPHY
Anesthesia: Moderate Sedation | Laterality: Bilateral

## 2017-09-22 MED ORDER — MIDAZOLAM HCL 2 MG/2ML IJ SOLN
INTRAMUSCULAR | Status: DC | PRN
  Administered 2017-09-22: 1 mg via INTRAVENOUS

## 2017-09-22 MED ORDER — LIDOCAINE HCL (PF) 1 % IJ SOLN
INTRAMUSCULAR | Status: AC
  Filled 2017-09-22: qty 30

## 2017-09-22 MED ORDER — ACETAMINOPHEN 325 MG PO TABS: 650.0000 mg | ORAL_TABLET | ORAL | Status: DC | PRN

## 2017-09-22 MED ORDER — SODIUM CHLORIDE 0.9% FLUSH: 3.0000 mL | INTRAVENOUS | Status: DC | PRN

## 2017-09-22 MED ORDER — FENTANYL CITRATE (PF) 100 MCG/2ML IJ SOLN
INTRAMUSCULAR | Status: DC | PRN
  Administered 2017-09-22: 25 ug via INTRAVENOUS

## 2017-09-22 MED ORDER — SODIUM CHLORIDE 0.9 % WEIGHT BASED INFUSION
1.0000 mL/kg/h | INTRAVENOUS | Status: DC
  Administered 2017-09-22: 1 mL/kg/h via INTRAVENOUS

## 2017-09-22 MED ORDER — MIDAZOLAM HCL 2 MG/2ML IJ SOLN
INTRAMUSCULAR | Status: AC
  Filled 2017-09-22: qty 2

## 2017-09-22 MED ORDER — HEPARIN (PORCINE) IN NACL 1000-0.9 UT/500ML-% IV SOLN
INTRAVENOUS | Status: AC
  Filled 2017-09-22: qty 1000

## 2017-09-22 MED ORDER — IOPAMIDOL (ISOVUE-300) INJECTION 61%
INTRAVENOUS | Status: DC | PRN
  Administered 2017-09-22: 170 mL via INTRA_ARTERIAL

## 2017-09-22 MED ORDER — SODIUM CHLORIDE 0.9 % WEIGHT BASED INFUSION: 3.0000 mL/kg/h | INTRAVENOUS | Status: AC

## 2017-09-22 MED ORDER — ONDANSETRON HCL 4 MG/2ML IJ SOLN: 4.0000 mg | Freq: Four times a day (QID) | INTRAMUSCULAR | Status: DC | PRN

## 2017-09-22 MED ORDER — ASPIRIN 81 MG PO CHEW: 81.0000 mg | CHEWABLE_TABLET | ORAL | Status: DC

## 2017-09-22 MED ORDER — FENTANYL CITRATE (PF) 100 MCG/2ML IJ SOLN
INTRAMUSCULAR | Status: AC
  Filled 2017-09-22: qty 2

## 2017-09-22 MED ORDER — SODIUM CHLORIDE 0.9% FLUSH: 3.0000 mL | Freq: Two times a day (BID) | INTRAVENOUS | Status: DC

## 2017-09-22 MED ORDER — SODIUM CHLORIDE 0.9 % IV SOLN: 250.0000 mL | INTRAVENOUS | Status: DC | PRN

## 2017-09-22 MED ORDER — SODIUM CHLORIDE 0.9 % WEIGHT BASED INFUSION: 1.0000 mL/kg/h | INTRAVENOUS | Status: DC

## 2017-09-22 SURGICAL SUPPLY — 15 items
CATH INFINITI 5FR ANG PIGTAIL (CATHETERS) ×3 IMPLANT
CATH INFINITI 5FR JL4 (CATHETERS) ×3 IMPLANT
CATH INFINITI 5FR JL5 (CATHETERS) ×3 IMPLANT
CATH INFINITI JR4 5F (CATHETERS) ×3 IMPLANT
CATH SWANZ 7F THERMO (CATHETERS) ×3 IMPLANT
DEVICE SAFEGUARD 24CM (GAUZE/BANDAGES/DRESSINGS) ×3 IMPLANT
GUIDEWIRE EMER 3M J .025X150CM (WIRE) ×3 IMPLANT
KIT MANI 3VAL PERCEP (MISCELLANEOUS) ×3 IMPLANT
KIT RIGHT HEART (MISCELLANEOUS) ×3 IMPLANT
NEEDLE PERC 18GX7CM (NEEDLE) ×3 IMPLANT
PACK CARDIAC CATH (CUSTOM PROCEDURE TRAY) ×3 IMPLANT
SHEATH AVANTI 5FR X 11CM (SHEATH) ×3 IMPLANT
SHEATH AVANTI 7FRX11 (SHEATH) ×3 IMPLANT
WIRE EMERALD 3MM-J .035X260CM (WIRE) ×3 IMPLANT
WIRE GUIDERIGHT .035X150 (WIRE) ×3 IMPLANT

## 2017-09-22 NOTE — Progress Notes (Signed)
Dr. Gwen PoundsKowalski at bedside, speaking extensively with family re: cath results. PAD to pt. Right groin clean, dry, intact without complications at site. VSS. No c/o CP,SOB, N/V, dizziness, HA, groin discomfort upon arrival to recovery. In no acute distress on arrival.

## 2017-09-22 NOTE — Progress Notes (Signed)
Pt. SOB on arrival.IV's x 2 placed.Pt. Poor historian re: medications and weight.  Pt. HOH. Pt. Family in to see pt. Before procedure. Fine rales at bilat. Post. Bases. .Marland Kitchen

## 2017-09-22 NOTE — Progress Notes (Signed)
PAD device off. 2x2 and sterile tegaderm to RFA/RFV sites. No hematoma, edema, drainage, ecchymosis. DC instructions reviewed again with pt. And his sister with verbalized understanding. Stable for DC home.

## 2017-09-23 ENCOUNTER — Encounter: Payer: Self-pay | Admitting: Internal Medicine

## 2018-01-29 ENCOUNTER — Other Ambulatory Visit: Payer: Self-pay

## 2018-01-29 ENCOUNTER — Inpatient Hospital Stay
Admission: EM | Admit: 2018-01-29 | Discharge: 2018-02-06 | DRG: 291 | Disposition: A | Discharge status: Skilled Nursing Facility | Payer: Medicare Other | Attending: Internal Medicine | Admitting: Internal Medicine

## 2018-01-29 ENCOUNTER — Emergency Department: Payer: Medicare Other

## 2018-01-29 DIAGNOSIS — K2970 Gastritis, unspecified, without bleeding: Secondary | ICD-10-CM | POA: Diagnosis present

## 2018-01-29 DIAGNOSIS — Z66 Do not resuscitate: Secondary | ICD-10-CM | POA: Diagnosis present

## 2018-01-29 DIAGNOSIS — L03116 Cellulitis of left lower limb: Secondary | ICD-10-CM | POA: Diagnosis not present

## 2018-01-29 DIAGNOSIS — Z79899 Other long term (current) drug therapy: Secondary | ICD-10-CM | POA: Diagnosis not present

## 2018-01-29 DIAGNOSIS — I130 Hypertensive heart and chronic kidney disease with heart failure and stage 1 through stage 4 chronic kidney disease, or unspecified chronic kidney disease: Secondary | ICD-10-CM | POA: Diagnosis present

## 2018-01-29 DIAGNOSIS — Z818 Family history of other mental and behavioral disorders: Secondary | ICD-10-CM

## 2018-01-29 DIAGNOSIS — R319 Hematuria, unspecified: Secondary | ICD-10-CM | POA: Diagnosis not present

## 2018-01-29 DIAGNOSIS — N183 Chronic kidney disease, stage 3 (moderate): Secondary | ICD-10-CM | POA: Diagnosis present

## 2018-01-29 DIAGNOSIS — Z7901 Long term (current) use of anticoagulants: Secondary | ICD-10-CM

## 2018-01-29 DIAGNOSIS — F17210 Nicotine dependence, cigarettes, uncomplicated: Secondary | ICD-10-CM | POA: Diagnosis not present

## 2018-01-29 DIAGNOSIS — L03115 Cellulitis of right lower limb: Secondary | ICD-10-CM | POA: Diagnosis not present

## 2018-01-29 DIAGNOSIS — I429 Cardiomyopathy, unspecified: Secondary | ICD-10-CM | POA: Diagnosis present

## 2018-01-29 DIAGNOSIS — I5023 Acute on chronic systolic (congestive) heart failure: Secondary | ICD-10-CM | POA: Diagnosis present

## 2018-01-29 DIAGNOSIS — I350 Nonrheumatic aortic (valve) stenosis: Secondary | ICD-10-CM | POA: Diagnosis present

## 2018-01-29 DIAGNOSIS — I248 Other forms of acute ischemic heart disease: Secondary | ICD-10-CM | POA: Diagnosis present

## 2018-01-29 DIAGNOSIS — J90 Pleural effusion, not elsewhere classified: Secondary | ICD-10-CM

## 2018-01-29 DIAGNOSIS — R778 Other specified abnormalities of plasma proteins: Secondary | ICD-10-CM

## 2018-01-29 DIAGNOSIS — I739 Peripheral vascular disease, unspecified: Secondary | ICD-10-CM | POA: Diagnosis not present

## 2018-01-29 DIAGNOSIS — D62 Acute posthemorrhagic anemia: Secondary | ICD-10-CM | POA: Diagnosis not present

## 2018-01-29 DIAGNOSIS — R609 Edema, unspecified: Secondary | ICD-10-CM

## 2018-01-29 DIAGNOSIS — I480 Paroxysmal atrial fibrillation: Secondary | ICD-10-CM | POA: Diagnosis present

## 2018-01-29 DIAGNOSIS — Z23 Encounter for immunization: Secondary | ICD-10-CM

## 2018-01-29 DIAGNOSIS — K449 Diaphragmatic hernia without obstruction or gangrene: Secondary | ICD-10-CM | POA: Diagnosis present

## 2018-01-29 DIAGNOSIS — E43 Unspecified severe protein-calorie malnutrition: Secondary | ICD-10-CM | POA: Diagnosis present

## 2018-01-29 DIAGNOSIS — Z87891 Personal history of nicotine dependence: Secondary | ICD-10-CM

## 2018-01-29 DIAGNOSIS — L89152 Pressure ulcer of sacral region, stage 2: Secondary | ICD-10-CM | POA: Diagnosis present

## 2018-01-29 DIAGNOSIS — Z820 Family history of epilepsy and other diseases of the nervous system: Secondary | ICD-10-CM

## 2018-01-29 DIAGNOSIS — K921 Melena: Secondary | ICD-10-CM | POA: Diagnosis not present

## 2018-01-29 DIAGNOSIS — K5731 Diverticulosis of large intestine without perforation or abscess with bleeding: Secondary | ICD-10-CM | POA: Diagnosis present

## 2018-01-29 DIAGNOSIS — R7989 Other specified abnormal findings of blood chemistry: Secondary | ICD-10-CM

## 2018-01-29 DIAGNOSIS — Z6821 Body mass index (BMI) 21.0-21.9, adult: Secondary | ICD-10-CM

## 2018-01-29 DIAGNOSIS — Z8711 Personal history of peptic ulcer disease: Secondary | ICD-10-CM

## 2018-01-29 DIAGNOSIS — K5730 Diverticulosis of large intestine without perforation or abscess without bleeding: Secondary | ICD-10-CM | POA: Diagnosis present

## 2018-01-29 DIAGNOSIS — J9601 Acute respiratory failure with hypoxia: Secondary | ICD-10-CM | POA: Diagnosis present

## 2018-01-29 DIAGNOSIS — R5383 Other fatigue: Secondary | ICD-10-CM

## 2018-01-29 DIAGNOSIS — L8990 Pressure ulcer of unspecified site, unspecified stage: Secondary | ICD-10-CM

## 2018-01-29 DIAGNOSIS — I5022 Chronic systolic (congestive) heart failure: Secondary | ICD-10-CM | POA: Diagnosis present

## 2018-01-29 DIAGNOSIS — Z803 Family history of malignant neoplasm of breast: Secondary | ICD-10-CM

## 2018-01-29 DIAGNOSIS — R600 Localized edema: Secondary | ICD-10-CM | POA: Diagnosis not present

## 2018-01-29 DIAGNOSIS — Z9114 Patient's other noncompliance with medication regimen: Secondary | ICD-10-CM

## 2018-01-29 LAB — CBC
HCT: 54.8 % — ABNORMAL HIGH (ref 39.0–52.0)
Hemoglobin: 17 g/dL (ref 13.0–17.0)
MCH: 31.4 pg (ref 26.0–34.0)
MCHC: 31 g/dL (ref 30.0–36.0)
MCV: 101.3 fL — ABNORMAL HIGH (ref 80.0–100.0)
Platelets: 180 10*3/uL (ref 150–400)
RBC: 5.41 MIL/uL (ref 4.22–5.81)
RDW: 16.5 % — ABNORMAL HIGH (ref 11.5–15.5)
WBC: 4.7 10*3/uL (ref 4.0–10.5)
nRBC: 0 % (ref 0.0–0.2)

## 2018-01-29 LAB — BASIC METABOLIC PANEL
Anion gap: 7 (ref 5–15)
BUN: 28 mg/dL — ABNORMAL HIGH (ref 8–23)
CO2: 29 mmol/L (ref 22–32)
CREATININE: 1.34 mg/dL — AB (ref 0.61–1.24)
Calcium: 9.1 mg/dL (ref 8.9–10.3)
Chloride: 107 mmol/L (ref 98–111)
GFR calc Af Amer: 59 mL/min — ABNORMAL LOW (ref >60)
GFR, EST NON AFRICAN AMERICAN: 51 mL/min — AB (ref >60)
Glucose, Bld: 156 mg/dL — ABNORMAL HIGH (ref 70–99)
Potassium: 4.1 mmol/L (ref 3.5–5.1)
Sodium: 143 mmol/L (ref 135–145)

## 2018-01-29 LAB — TROPONIN I: Troponin I: 0.25 ng/mL (ref <0.03)

## 2018-01-29 MED ORDER — SENNOSIDES-DOCUSATE SODIUM 8.6-50 MG PO TABS: 1.0000 | ORAL_TABLET | Freq: Every evening | ORAL | Status: DC | PRN

## 2018-01-29 MED ORDER — INFLUENZA VAC SPLIT HIGH-DOSE 0.5 ML IM SUSY
0.5000 mL | PREFILLED_SYRINGE | INTRAMUSCULAR | Status: DC
  Filled 2018-01-29: qty 0.5

## 2018-01-29 MED ORDER — SODIUM CHLORIDE 0.9% FLUSH
3.0000 mL | Freq: Two times a day (BID) | INTRAVENOUS | Status: DC
  Administered 2018-01-29 – 2018-02-06 (×16): 3 mL via INTRAVENOUS

## 2018-01-29 MED ORDER — SODIUM CHLORIDE 0.9% FLUSH
3.0000 mL | INTRAVENOUS | Status: DC | PRN
  Administered 2018-01-31: 3 mL via INTRAVENOUS
  Filled 2018-01-29: qty 3

## 2018-01-29 MED ORDER — ACETAMINOPHEN 325 MG PO TABS
650.0000 mg | ORAL_TABLET | Freq: Four times a day (QID) | ORAL | Status: DC | PRN
  Administered 2018-01-30: 650 mg via ORAL
  Filled 2018-01-29: qty 2

## 2018-01-29 MED ORDER — BISACODYL 5 MG PO TBEC: 5.0000 mg | DELAYED_RELEASE_TABLET | Freq: Every day | ORAL | Status: DC | PRN

## 2018-01-29 MED ORDER — ONDANSETRON HCL 4 MG PO TABS: 4.0000 mg | ORAL_TABLET | Freq: Four times a day (QID) | ORAL | Status: DC | PRN

## 2018-01-29 MED ORDER — FUROSEMIDE 10 MG/ML IJ SOLN
40.0000 mg | Freq: Two times a day (BID) | INTRAMUSCULAR | Status: DC
  Administered 2018-01-30 – 2018-02-02 (×8): 40 mg via INTRAVENOUS
  Filled 2018-01-29 (×8): qty 4

## 2018-01-29 MED ORDER — HYDROCODONE-ACETAMINOPHEN 5-325 MG PO TABS
1.0000 | ORAL_TABLET | ORAL | Status: DC | PRN
  Administered 2018-01-31 (×2): 1 via ORAL
  Filled 2018-01-29 (×2): qty 1

## 2018-01-29 MED ORDER — ONDANSETRON HCL 4 MG/2ML IJ SOLN: 4.0000 mg | Freq: Four times a day (QID) | INTRAMUSCULAR | Status: DC | PRN

## 2018-01-29 MED ORDER — APIXABAN 5 MG PO TABS
5.0000 mg | ORAL_TABLET | Freq: Two times a day (BID) | ORAL | Status: DC
  Administered 2018-01-29 – 2018-02-01 (×7): 5 mg via ORAL
  Filled 2018-01-29 (×8): qty 1

## 2018-01-29 MED ORDER — METOPROLOL SUCCINATE ER 50 MG PO TB24
50.0000 mg | ORAL_TABLET | Freq: Every day | ORAL | Status: DC
  Administered 2018-01-30 – 2018-02-02 (×4): 50 mg via ORAL
  Filled 2018-01-29 (×4): qty 1

## 2018-01-29 MED ORDER — FUROSEMIDE 10 MG/ML IJ SOLN
80.0000 mg | Freq: Once | INTRAMUSCULAR | Status: AC
  Administered 2018-01-29: 80 mg via INTRAVENOUS
  Filled 2018-01-29: qty 8

## 2018-01-29 MED ORDER — ALBUTEROL SULFATE (2.5 MG/3ML) 0.083% IN NEBU: 2.5000 mg | INHALATION_SOLUTION | RESPIRATORY_TRACT | Status: DC | PRN

## 2018-01-29 MED ORDER — ACETAMINOPHEN 650 MG RE SUPP: 650.0000 mg | Freq: Four times a day (QID) | RECTAL | Status: DC | PRN

## 2018-01-29 MED ORDER — SODIUM CHLORIDE 0.9 % IV SOLN: 250.0000 mL | INTRAVENOUS | Status: DC | PRN

## 2018-01-29 MED ORDER — LISINOPRIL 20 MG PO TABS
20.0000 mg | ORAL_TABLET | Freq: Every day | ORAL | Status: DC
Start: 2018-01-29 — End: 2018-02-03
  Administered 2018-01-30 – 2018-02-02 (×4): 20 mg via ORAL
  Filled 2018-01-29 (×4): qty 1

## 2018-01-29 NOTE — ED Triage Notes (Signed)
Pt arrives with son for bilat leg swelling. Has CHF. Denies weight gain. Hypoxic in triage.

## 2018-01-29 NOTE — ED Provider Notes (Signed)
[facility] Emergency Department Provider Note    ____________________________________________   I have reviewed the triage vital signs and the nursing notes.   HISTORY  Chief Complaint Shortness of Breath and Leg Swelling   History limited by: Not Limited, some history obtained from son   HPI Austin Zamora is a 77 y.o. male who presents to the emergency department today because of concern for lower leg swelling. The son states that the swelling has been getting worse over a couple of weeks. There is concern that the patient has not been taking his lasix as prescribed, and son thinks that he will miss 3-4 doses at a time. The patient has also noticed increased weakness and decreased ability to ambulate recently. Denies any chest pain. Has not have any fevers. Son also has concerns that he might have a penile infection given recent swelling.     Per medical record review patient has a history of AKI  Past Medical History:  Diagnosis Date  . Acute kidney injury (HCC)   . Acute renal failure (ARF) (HCC)   . Alcohol abuse   . Aortic stenosis   . Nonrheumatic aortic valve stenosis   . Paroxysmal A-fib (HCC)   . Protein calorie malnutrition (HCC)   . SOBOE (shortness of breath on exertion)     Patient Active Problem List   Diagnosis Date Noted  . Acute renal failure (ARF) (HCC)   . Acute kidney injury (HCC)   . Nonrheumatic aortic valve stenosis   . Influenza A 05/17/2017  . Protein-calorie malnutrition, severe 05/17/2017  . Paroxysmal A-fib (HCC) 04/06/2017  . SOBOE (shortness of breath on exertion) 04/06/2017  . Alcohol abuse 10/16/2014  . Aortic stenosis 09/06/2014    Past Surgical History:  Procedure Laterality Date  . RIGHT/LEFT HEART CATH AND CORONARY ANGIOGRAPHY Bilateral 09/22/2017   Procedure: RIGHT/LEFT HEART CATH AND CORONARY ANGIOGRAPHY;  Surgeon: Lamar Blinks, MD;  Location: [facility] INVASIVE CV LAB;  Service: Cardiovascular;   Laterality: Bilateral;    Prior to Admission medications   Medication Sig Start Date End Date Taking? Authorizing Provider  ELIQUIS 5 MG TABS tablet Take 5 mg by mouth 2 (two) times daily.  04/06/17   [provider]  feeding supplement, ENSURE ENLIVE, (ENSURE ENLIVE) LIQD Take 237 mLs by mouth 3 (three) times daily between meals. 05/18/17   Adrian Saran, MD  furosemide (LASIX) 20 MG tablet Take 20 mg by mouth daily.  04/06/17   [provider]  lisinopril (PRINIVIL,ZESTRIL) 20 MG tablet Take 20 mg by mouth daily.  05/15/17   [provider]  metoprolol succinate (TOPROL-XL) 50 MG 24 hr tablet Take 50 mg by mouth daily.  04/06/17   [provider]  nicotine (NICODERM CQ - DOSED IN MG/24 HOURS) 14 mg/24hr patch Place 1 patch (14 mg total) onto the skin daily. 05/18/17   Adrian Saran, MD  PROAIR HFA 108 [phone] Base) MCG/ACT inhaler  05/15/17   [provider]    Allergies Patient has no known allergies.  History reviewed. No pertinent family history.  Social History Social History   Tobacco Use  . Smoking status: Former Smoker    Packs/day: 0.50    Years: 60.00    Pack years: 30.00    Types: Cigarettes  . Smokeless tobacco: Never Used  . Tobacco comment: quit briefly in march  Substance Use Topics  . Alcohol use: Yes    Alcohol/week: 2.0 standard drinks    Types: 2 Cans  of beer per week    Comment: Drinks two 40's daily  . Drug use: No    Review of Systems Constitutional: No fever/chills Eyes: No visual changes. ENT: No sore throat. Cardiovascular: Denies chest pain. Respiratory: Positive for shortness of breath.  Gastrointestinal: No abdominal pain.  No nausea, no vomiting.  No diarrhea.   Genitourinary: Positive for penile swelling.  Musculoskeletal: Positive for lower extremity edema.  Skin: Negative for rash. Neurological: Negative for headaches, focal weakness or numbness.  ____________________________________________   PHYSICAL  EXAM:  VITAL SIGNS: ED Triage Vitals  Enc Vitals Group     BP 01/29/18 1524 126/76     Pulse Rate 01/29/18 1524 88     Resp 01/29/18 1524 18     Temp 01/29/18 1524 (!) 97.3 F (36.3 C)     Temp Source 01/29/18 1524 Axillary     SpO2 01/29/18 1524 (!) 85 %     Weight 01/29/18 1526 155 lb (70.3 kg)     Height 01/29/18 1526 6' (1.829 m)     Head Circumference --      Peak Flow --      Pain Score 01/29/18 1526 0   Constitutional: Alert and oriented.  Eyes: Conjunctivae are normal.  ENT      Head: Normocephalic and atraumatic.      Nose: No congestion/rhinnorhea.      Mouth/Throat: Mucous membranes are moist.      Neck: No stridor. Hematological/Lymphatic/Immunilogical: No cervical lymphadenopathy. Cardiovascular: Normal rate, regular rhythm.  No murmurs, rubs, or gallops.  Respiratory: Slightly increased respiratory effort. Decreased breath sounds in the lower lungs.  Gastrointestinal: Soft and non tender. No rebound. No guarding.  Genitourinary: Penile swelling, no erythema, no tenderness.  Musculoskeletal: Normal range of motion in all extremities. 2+ bilateral pitting edema.  Neurologic:  Normal speech and language. No gross focal neurologic deficits are appreciated.  Skin:  Skin is warm, dry and intact. No rash noted. Psychiatric: Mood and affect are normal. Speech and behavior are normal. Patient exhibits appropriate insight and judgment.  ____________________________________________    LABS (pertinent positives/negatives)  Trop 0.25 CBC wbc 4.7, hgb 17.0, plt 180 BMP na 143, k 4.1, glu 156, cr 1.34, ca 9.1  ____________________________________________   EKG  I, Phineas Semen, attending physician, personally viewed and interpreted this EKG  EKG Time: 1532 Rate: 87 Rhythm: atrial fibrillation Axis: normal Intervals: qtc 450 QRS: narrow ST changes: no st elevation, t wave inversion v5, v6 Impression: abnormal  ekg  ____________________________________________    RADIOLOGY  CXR Bilateral pleural effusions  ____________________________________________   PROCEDURES  Procedures  ____________________________________________   INITIAL IMPRESSION / ASSESSMENT AND PLAN / ED COURSE  Pertinent labs & imaging results that were available during my care of the patient were reviewed by me and considered in my medical decision making (see chart for details).   Patient presents to the emergency department today because of concerns for lower extremity swelling as well as fatigue.  Patient does have a history of heart failure and does have significant bilateral pitting edema.  Chest x-ray does show some pleural effusions.  At this point I think pleural effusions and edema causing the patient's fatigue as well as low oxygen sats were noted here.  Will plan on admission and start patient on IV diuretics.  Discussed findings with patient family.  In addition patient had a smiled elevation of troponin.  At this point I doubt ACS think likely secondary to heart failure.   ____________________________________________  FINAL CLINICAL IMPRESSION(S) / ED DIAGNOSES  Final diagnoses:  Other fatigue  Peripheral edema  Elevated troponin  Pleural effusion     Note: This dictation was prepared with Dragon dictation. Any transcriptional errors that result from this process are unintentional     Phineas SemenGoodman, Romen Yutzy, MD 01/29/18 1658

## 2018-01-29 NOTE — H&P (Signed)
[facility] [place] [facility]   PATIENT NAME: Austin Zamora    MR#:  [id number]  DATE OF BIRTH:  10/20/1940  DATE OF ADMISSION:  01/29/2018  PRIMARY CARE PHYSICIAN: Guadlupe SpanishSifferman, Teresa M, PA-C   REQUESTING/REFERRING PHYSICIAN: Dr. Derrill KayGoodman.  CHIEF COMPLAINT:   Chief Complaint  Patient presents with  . Shortness of Breath  . Leg Swelling   Shortness of breath and leg swelling for 2 weeks HISTORY OF PRESENT ILLNESS:  Austin Zamora  is a 77 y.o. male with a known history of CKD, alcohol abuse, aortic stenosis, paroxysmal A. fib and systolic CHF.  The patient presented to ED with above chief complaints.  The patient is not a good historian.  According to Dr. Effie Shyoleman, the son stated that the patient has had worsening shortness of breath and leg swelling for the past couple of weeks.  He has not been taking Lasix as prescribed.  He has cough, shortness of breath and leg swelling but denies any fever or chills.  He is found hypoxia and put on oxygen 2 L.  Chest x-ray show bilateral pleural effusion. PAST MEDICAL HISTORY:   Past Medical History:  Diagnosis Date  . Acute kidney injury (HCC)   . Acute renal failure (ARF) (HCC)   . Alcohol abuse   . Aortic stenosis   . Nonrheumatic aortic valve stenosis   . Paroxysmal A-fib (HCC)   . Protein calorie malnutrition (HCC)   . SOBOE (shortness of breath on exertion)     PAST SURGICAL HISTORY:   Past Surgical History:  Procedure Laterality Date  . RIGHT/LEFT HEART CATH AND CORONARY ANGIOGRAPHY Bilateral 09/22/2017   Procedure: RIGHT/LEFT HEART CATH AND CORONARY ANGIOGRAPHY;  Surgeon: Lamar BlinksKowalski, Bruce J, MD;  Location: [facility] INVASIVE CV LAB;  Service: Cardiovascular;  Laterality: Bilateral;    SOCIAL HISTORY:   Social History   Tobacco Use  . Smoking status: Former Smoker    Packs/day: 0.50    Years: 60.00    Pack years: 30.00    Types: Cigarettes  . Smokeless tobacco: Never Used  . Tobacco comment: quit briefly  in march  Substance Use Topics  . Alcohol use: Yes    Alcohol/week: 2.0 standard drinks    Types: 2 Cans of beer per week    Comment: Drinks two 40's daily    FAMILY HISTORY:   Family History  Problem Relation Age of Onset  . Breast cancer Mother   . Parkinson's disease Father     DRUG ALLERGIES:  No Known Allergies  REVIEW OF SYSTEMS:   Review of Systems  Constitutional: Negative for chills, fever and malaise/fatigue.  HENT: Negative for sore throat.   Eyes: Negative for blurred vision and double vision.  Respiratory: Positive for cough and shortness of breath. Negative for hemoptysis, wheezing and stridor.   Cardiovascular: Positive for leg swelling. Negative for chest pain, palpitations and orthopnea.  Gastrointestinal: Negative for abdominal pain, blood in stool, diarrhea, melena, nausea and vomiting.  Genitourinary: Negative for dysuria, flank pain and hematuria.  Musculoskeletal: Negative for back pain and joint pain.  Skin: Negative for rash.  Neurological: Negative for dizziness, sensory change, focal weakness, seizures, loss of consciousness, weakness and headaches.  Endo/Heme/Allergies: Negative for polydipsia.  Psychiatric/Behavioral: Negative for depression. The patient is not nervous/anxious.     MEDICATIONS AT HOME:   Prior to Admission medications   Medication Sig Start Date End Date Taking? Authorizing Provider  ELIQUIS 5 MG TABS tablet Take 5 mg by  mouth 2 (two) times daily.  04/06/17  Yes [provider]  furosemide (LASIX) 20 MG tablet Take 20 mg by mouth daily.  04/06/17  Yes [provider]  lisinopril (PRINIVIL,ZESTRIL) 20 MG tablet Take 20 mg by mouth daily.  05/15/17  Yes [provider]  metoprolol succinate (TOPROL-XL) 50 MG 24 hr tablet Take 50 mg by mouth daily.  04/06/17  Yes [provider]  PROAIR HFA 108 (90 Base) MCG/ACT inhaler Inhale 2 puffs into the lungs every 4 (four) hours as needed for wheezing or  shortness of breath.  05/15/17  Yes [provider]  feeding supplement, ENSURE ENLIVE, (ENSURE ENLIVE) LIQD Take 237 mLs by mouth 3 (three) times daily between meals. 05/18/17   Adrian Saran, MD  nicotine (NICODERM CQ - DOSED IN MG/24 HOURS) 14 mg/24hr patch Place 1 patch (14 mg total) onto the skin daily. 05/18/17   Adrian Saran, MD      VITAL SIGNS:  Blood pressure (!) 136/96, pulse 88, temperature (!) 97.3 F (36.3 C), temperature source Axillary, resp. rate (!) 23, height 6' (1.829 m), weight 70.3 kg, SpO2 (!) 85 %.  PHYSICAL EXAMINATION:  Physical Exam  GENERAL:  77 y.o.-year-old patient lying in the bed with no acute distress.  EYES: Pupils equal, round, reactive to light and accommodation. No scleral icterus. Extraocular muscles intact.  HEENT: Head atraumatic, normocephalic. Oropharynx and nasopharynx clear.  NECK:  Supple, no jugular venous distention. No thyroid enlargement, no tenderness.  LUNGS: Diminished l breath sounds bilaterally, no wheezing, rales,rhonchi or crepitation. No use of accessory muscles of respiration.  CARDIOVASCULAR: S1, S2 normal. No murmurs, rubs, or gallops.  ABDOMEN: Soft, nontender, nondistended. Bowel sounds present. No organomegaly or mass.  EXTREMITIES: Bilateral leg swelling 2+ . no cyanosis, or clubbing.  NEUROLOGIC: Cranial nerves II through XII are intact. Muscle strength 4/5 in all extremities. Sensation intact. Gait not checked.  PSYCHIATRIC: The patient is alert and oriented x 3.  SKIN: No obvious rash, lesion, or ulcer.   LABORATORY PANEL:   CBC Recent Labs  Lab 01/29/18 1528  WBC 4.7  HGB 17.0  HCT 54.8*  PLT 180   ------------------------------------------------------------------------------------------------------------------  Chemistries  Recent Labs  Lab 01/29/18 1528  NA 143  K 4.1  CL 107  CO2 29  GLUCOSE 156*  BUN 28*  CREATININE 1.34*  CALCIUM 9.1    ------------------------------------------------------------------------------------------------------------------  Cardiac Enzymes Recent Labs  Lab 01/29/18 1528  TROPONINI 0.25*   ------------------------------------------------------------------------------------------------------------------  RADIOLOGY:  Dg Chest 2 View  Result Date: 01/29/2018 CLINICAL DATA:  Lower extremity swelling which shortness-of-breath. Possible gentle infection. EXAM: CHEST - 2 VIEW COMPARISON:  05/18/2017 FINDINGS: Lungs are adequately inflated demonstrate a moderate size right pleural effusion which is worse with small left pleural effusion which is worse. There is likely bibasilar associated compressive atelectasis. Infection in the lung bases is possible. Remainder of the exam is unchanged. IMPRESSION: Worsening bilateral pleural effusions right greater than left likely with associated bibasilar atelectasis. Infection in the lung bases is possible. Electronically Signed   By: Elberta Fortis M.D.   On: 01/29/2018 16:11      IMPRESSION AND PLAN:   Acute respiratory failure with hypoxia due to acute on chronic systolic CHF with ejection fraction 20%. The patient will be admitted to telemetry floor. Start CHF protocol, Lasix 40 mg IV twice daily, continue lisinopril and Lopressor, follow-up cardiology consult.  Elevated troponin, possible due to demanding ischemia secondary to above. The patient is on Eliquis.  Paroxysmal A. fib.  Continue Eliquis and Lopressor.  Hypertension.  Continue Lopressor and lisinopril.  CKD stage III.  Stable.  All the records are reviewed and case discussed with ED provider. Management plans discussed with the patient, his son and they are in agreement.  CODE STATUS: DNR.  TOTAL TIME TAKING CARE OF THIS PATIENT: 36 minutes.    Shaune Pollack M.D on 01/29/2018 at 4:56 PM  Between 7am to 6pm - Pager - [phone]  After 6pm go to [email or web address] - Air traffic controller  [facility] [place] [facility]  Office  [phone]  CC: Primary care physician; Guadlupe Spanish, PA-C   Note: This dictation was prepared with Dragon dictation along with smaller phrase technology. Any transcriptional errors that result from this process are unin

## 2018-01-29 NOTE — Progress Notes (Signed)
Advanced Care Plan.  Purpose of Encounter: CODE STATUS. Parties in Attendance: The patient, his son and me. Patient's Decisional Capacity: Yes. Medical Story: Austin Zamora  is a 77 y.o. male with a known history of HTN, HLP, CKD, alcohol abuse, aortic stenosis, paroxysmal A. fib and systolic CHF.  The patient is being admitted for acute respiratory failure with hypoxia due to acute on chronic systolic CHF ejection fraction 20%.  I discussed with the patient about his current condition, poor prognosis and CODE STATUS.  The patient stated that he does not want to be resuscitated or intubated if he has a cardiopulmonary arrest.  This is confirmed with his son. Plan:  Code Status: DNR. Time spent discussing advance care planning: 18 minutes.

## 2018-01-30 DIAGNOSIS — L8990 Pressure ulcer of unspecified site, unspecified stage: Secondary | ICD-10-CM

## 2018-01-30 LAB — BASIC METABOLIC PANEL
Anion gap: 8 (ref 5–15)
BUN: 28 mg/dL — ABNORMAL HIGH (ref 8–23)
CO2: 32 mmol/L (ref 22–32)
CREATININE: 1.24 mg/dL (ref 0.61–1.24)
Calcium: 9 mg/dL (ref 8.9–10.3)
Chloride: 103 mmol/L (ref 98–111)
GFR calc Af Amer: >60 mL/min (ref >60)
GFR calc non Af Amer: 56 mL/min — ABNORMAL LOW (ref >60)
Glucose, Bld: 109 mg/dL — ABNORMAL HIGH (ref 70–99)
Potassium: 4.7 mmol/L (ref 3.5–5.1)
SODIUM: 143 mmol/L (ref 135–145)

## 2018-01-30 LAB — CBC
HCT: 52.9 % — ABNORMAL HIGH (ref 39.0–52.0)
Hemoglobin: 16.3 g/dL (ref 13.0–17.0)
MCH: 30.8 pg (ref 26.0–34.0)
MCHC: 30.8 g/dL (ref 30.0–36.0)
MCV: 100 fL (ref 80.0–100.0)
Platelets: 163 10*3/uL (ref 150–400)
RBC: 5.29 MIL/uL (ref 4.22–5.81)
RDW: 16.1 % — AB (ref 11.5–15.5)
WBC: 4.9 10*3/uL (ref 4.0–10.5)
nRBC: 0 % (ref 0.0–0.2)

## 2018-01-30 LAB — MAGNESIUM: Magnesium: 1.9 mg/dL (ref 1.7–2.4)

## 2018-01-30 LAB — TROPONIN I
Troponin I: 0.21 ng/mL (ref <0.03)
Troponin I: 0.15 ng/mL (ref <0.03)

## 2018-01-30 NOTE — Progress Notes (Signed)
RN concerned about worsening of leg discoloration. Patients feet and toes dark maroon color, cold to the touch. Pulses audible with doppler. Patient does not report any associated pain. Fingers and knee caps also noted to be purple. MD Anne HahnWillis notified. MD evaluated patient at bedside. No new orders at this time. Will continue to monitor.  Thalia Bloodgood.Tallyn Holroyd, Orlan Leavensakera M

## 2018-01-30 NOTE — Progress Notes (Signed)
[facility] [place] at [facility]   PATIENT NAME: Austin FoldsHarry Zamora    MR#:  [id number]  DATE OF BIRTH:  08/24/1940  SUBJECTIVE:  CHIEF COMPLAINT: She is lethargic but arousable and reports shortness of breath is improving  REVIEW OF SYSTEMS:  CONSTITUTIONAL: No fever, fatigue or weakness.  EYES: No blurred or double vision.  EARS, NOSE, AND THROAT: No tinnitus or ear pain.  RESPIRATORY: No cough, reports improving shortness of breath, wheezing or hemoptysis.  CARDIOVASCULAR: No chest pain, orthopnea, edema.  GASTROINTESTINAL: No nausea, vomiting, diarrhea or abdominal pain.  MUSCULOSKELETAL: No joint pain or arthritis.   NEUROLOGIC: No tingling, numbness, weakness.  PSYCHIATRY: No anxiety or depression.   DRUG ALLERGIES:  No Known Allergies  VITALS:  Blood pressure 138/87, pulse 78, temperature 98.4 F (36.9 C), resp. rate (!) 21, height 6' (1.829 m), weight 73.5 kg, SpO2 97 %.  PHYSICAL EXAMINATION:  GENERAL:  77 y.o.-year-old patient lying in the bed with no acute distress.  EYES: Pupils equal, round, reactive to light and accommodation. No scleral icterus. Extraocular muscles intact.  HEENT: Head atraumatic, normocephalic. Oropharynx and nasopharynx clear.  NECK:  Supple, no jugular venous distention. No thyroid enlargement, no tenderness.  LUNGS: Moderate breath sounds bilaterally, no wheezing, minimal rales,rhonchi, no crepitation. No use of accessory muscles of respiration.  CARDIOVASCULAR: S1, S2 normal. No murmurs, rubs, or gallops.  ABDOMEN: Soft, nontender, nondistended. Bowel sounds present. No organomegaly or mass.  EXTREMITIES: No pedal edema, cyanosis, or clubbing.  NEUROLOGIC: Lethargic but arousable sensation intact. Gait not checked.  PSYCHIATRIC: The patient is lethargic but arousable.  SKIN: No obvious rash, lesion, or ulcer.    LABORATORY PANEL:   CBC Recent Labs  Lab 01/30/18 0408  WBC 4.9  HGB 16.3  HCT 52.9*  PLT 163    ------------------------------------------------------------------------------------------------------------------  Chemistries  Recent Labs  Lab 01/30/18 0408  NA 143  K 4.7  CL 103  CO2 32  GLUCOSE 109*  BUN 28*  CREATININE 1.24  CALCIUM 9.0  MG 1.9   ------------------------------------------------------------------------------------------------------------------  Cardiac Enzymes Recent Labs  Lab 01/29/18 1528  TROPONINI 0.25*   ------------------------------------------------------------------------------------------------------------------  RADIOLOGY:  Dg Chest 2 View  Result Date: 01/29/2018 CLINICAL DATA:  Lower extremity swelling which shortness-of-breath. Possible gentle infection. EXAM: CHEST - 2 VIEW COMPARISON:  05/18/2017 FINDINGS: Lungs are adequately inflated demonstrate a moderate size right pleural effusion which is worse with small left pleural effusion which is worse. There is likely bibasilar associated compressive atelectasis. Infection in the lung bases is possible. Remainder of the exam is unchanged. IMPRESSION: Worsening bilateral pleural effusions right greater than left likely with associated bibasilar atelectasis. Infection in the lung bases is possible. Electronically Signed   By: Elberta Fortisaniel  Boyle M.D.   On: 01/29/2018 16:11    EKG:   Orders placed or performed during the hospital encounter of 01/29/18  . ED EKG within 10 minutes  . ED EKG within 10 minutes  . EKG 12-Lead  . 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 CHF protocol, monitor daily weights, intake and output  Lasix 40 mg IV twice daily, continue lisinopril and Lopressor On 3 L of oxygen via nasal cannula will wean off as tolerated [facility]  card[facility] consult.  Discussed with Dr. call wood  Elevated troponin, possible due to demanding ischemia secondary to above. Troponin 0  0.25 we will  cycle cardiac biomarkers The patient is on Eliquis.  Paroxysmal A. fib.    Rate controlled continue Eliquis and Lopressor.  Hypertension.    Blood pressure stable continue Lopressor and lisinopril.  CKD stage III.  Stable.  Creatinine at 1.34-1.24 today  Generalized weakness physical therapy assessment    All the records are reviewed and case discussed with Care Management/Social Workerr. Management plans discussed with the patient, family and they are in agreement.  CODE STATUS: DNR  TOTAL TIME TAKING CARE OF THIS PATIENT: 35 minutes.   POSSIBLE D/C IN 2 DAYS, DEPENDING ON CLINICAL CONDITION.  Note: This dictation was prepared with Dragon dictation along with smaller phrase technology. Any transcriptional errors that result from this process are unintentional.   Ramonita Lab M.D on 01/30/2018 at 4:02 PM  Between 7am to 6pm - Pager - [phone] After 6pm go to [email or web address] - password [email or web address] [facility]  [place] [facility]  Office  [phone]  CC: Primary care physician; Alwyn Pea, MD

## 2018-01-30 NOTE — Consult Note (Signed)
Reason for Consult: Shortness of breath leg swelling Referring Physician: Dr. Imogene Burn hospitalist Rosey Bath Sifferman PA-C  Austin Zamora is an 77 y.o. male.  HPI: Patient presents with significant shortness of breath known congestive heart failure with cardiomyopathy ejection fraction around 20% history of chronic renal insufficiency alcohol abuse aortic stenosis paroxysmal A. fib.  Patient complains of worsening dyspnea shortness of breath with leg swelling over the last few weeks.  Patient has not been taking Lasix as prescribed began having cough and shortness of breath leg swelling denies any fever chills or sweats or sputum production.  Patient was found to be hypoxic was put on 2 L of O2 chest x-ray suggested bilateral pleural effusion with pulmonary edema to the patient admitted for further assessment and care was found to have elevated troponin.  Denies any significant chest pain or anginal symptoms  Past Medical History:  Diagnosis Date  . Acute kidney injury (HCC)   . Acute renal failure (ARF) (HCC)   . Alcohol abuse   . Aortic stenosis   . Nonrheumatic aortic valve stenosis   . Paroxysmal A-fib (HCC)   . Protein calorie malnutrition (HCC)   . SOBOE (shortness of breath on exertion)     Past Surgical History:  Procedure Laterality Date  . RIGHT/LEFT HEART CATH AND CORONARY ANGIOGRAPHY Bilateral 09/22/2017   Procedure: RIGHT/LEFT HEART CATH AND CORONARY ANGIOGRAPHY;  Surgeon: Lamar Blinks, MD;  Location: [facility] INVASIVE CV LAB;  Service: Cardiovascular;  Laterality: Bilateral;    Family History  Problem Relation Age of Onset  . Breast cancer Mother   . Parkinson's disease Father     Social History:  reports that he has quit smoking. His smoking use included cigarettes. He has a 30.00 pack-year smoking history. He has never used smokeless tobacco. He reports that he drinks about 2.0 standard drinks of alcohol per week. He reports that he does not use drugs.  Allergies: No Known  Allergies  Medications: I have reviewed the patient's current medications.  Results for orders placed or performed during the hospital encounter of 01/29/18 (from the past 48 hour(s))  Basic metabolic panel     Status: Abnormal   Collection Time: 01/29/18  3:28 PM  Result Value Ref Range   Sodium 143 135 - 145 mmol/L   Potassium 4.1 3.5 - 5.1 mmol/L   Chloride 107 98 - 111 mmol/L   CO2 29 22 - 32 mmol/L   Glucose, Bld 156 (H) 70 - 99 mg/dL   BUN 28 (H) 8 - 23 mg/dL   Creatinine, Ser 1.61 (H) 0.61 - 1.24 mg/dL   Calcium 9.1 8.9 - 09.6 mg/dL   GFR calc non Af Amer 51 (L) >60 mL/min   GFR calc Af Amer 59 (L) >60 mL/min   Anion gap 7 5 - 15    Comment: Performed at [facility] [address] [place], [place] [address]  CBC     Status: Abnormal   Collection Time: 01/29/18  3:28 PM  Result Value Ref Range   WBC 4.7 4.0 - 10.5 K/uL   RBC 5.41 4.22 - 5.81 MIL/uL   Hemoglobin 17.0 13.0 - 17.0 g/dL   HCT 98.1 (H) 19.1 - 47.8 %   MCV 101.3 (H) 80.0 - 100.0 fL   MCH 31.4 26.0 - 34.0 pg   MCHC 31.0 30.0 - 36.0 g/dL   RDW 29.5 (H) 62.1 - 30.8 %   Platelets 180 150 - 400 K/uL   nRBC 0.0 0.0 -  0.2 %    Comment: Performed at [facility], [address] [place], [place] [address]  Troponin I - ONCE - STAT     Status: Abnormal   Collection Time: 01/29/18  3:28 PM  Result Value Ref Range   Troponin I 0.25 (HH) <0.03 ng/mL    Comment: CRITICAL RESULT CALLED TO, READ BACK BY AND VERIFIED WITH SHERRI ALLISON @1608  01/29/18 AKT Performed at [facility], [address] [place] [address] [place], [place] [address]   Basic metabolic panel     Status: Abnormal   Collection Time: 01/30/18  4:08 AM  Result Value Ref Range   Sodium 143 135 - 145 mmol/L   Potassium 4.7 3.5 - 5.1 mmol/L    Comment: HEMOLYSIS AT THIS LEVEL MAY AFFECT RESULT   Chloride 103 98 - 111 mmol/L   CO2 32 22 - 32 mmol/L   Glucose, Bld 109 (H) 70 - 99 mg/dL   BUN 28 (H) 8 - 23 mg/dL   Creatinine, Ser 0.981.24  0.61 - 1.24 mg/dL   Calcium 9.0 8.9 - 11.910.3 mg/dL   GFR calc non Af Amer 56 (L) >60 mL/min   GFR calc Af Amer >60 >60 mL/min   Anion gap 8 5 - 15    Comment: Performed at [facility], [address] [place], [place] [address]  CBC     Status: Abnormal   Collection Time: 01/30/18  4:08 AM  Result Value Ref Range   WBC 4.9 4.0 - 10.5 K/uL   RBC 5.29 4.22 - 5.81 MIL/uL   Hemoglobin 16.3 13.0 - 17.0 g/dL   HCT 95.652.9 (H) 21.339.0 - 08.652.0 %   MCV 100.0 80.0 - 100.0 fL   MCH 30.8 26.0 - 34.0 pg   MCHC 30.8 30.0 - 36.0 g/dL   RDW 57.816.1 (H) 46.911.5 - 62.915.5 %   Platelets 163 150 - 400 K/uL   nRBC 0.0 0.0 - 0.2 %    Comment: Performed at [facility], [address] [place], [place] [address]  Magnesium     Status: None   Collection Time: 01/30/18  4:08 AM  Result Value Ref Range   Magnesium 1.9 1.7 - 2.4 mg/dL    Comment: Performed at [facility], [address] [place], [place] [address]  Troponin I - Now Then Q6H     Status: Abnormal   Collection Time: 01/30/18  4:18 PM  Result Value Ref Range   Troponin I 0.21 (HH) <0.03 ng/mL    Comment: CRITICAL VALUE NOTED. VALUE IS CONSISTENT WITH PREVIOUSLY REPORTED/CALLED VALUE.PMF Performed at [facility], [address] [place], [place] [address]     Dg Chest 2 View  Result Date: 01/29/2018 CLINICAL DATA:  Lower extremity swelling which shortness-of-breath. Possible gentle infection. EXAM: CHEST - 2 VIEW COMPARISON:  05/18/2017 FINDINGS: Lungs are adequately inflated demonstrate a moderate size right pleural effusion which is worse with small left pleural effusion which is worse. There is likely bibasilar associated compressive atelectasis. Infection in the lung bases is possible. Remainder of the exam is unchanged. IMPRESSION: Worsening bilateral pleural effusions right greater than left likely with associated bibasilar atelectasis. Infection in the lung bases is possible. Electronically Signed   By: Elberta Fortisaniel   Boyle M.D.   On: 01/29/2018 16:11    Review of Systems  Constitutional: Positive for diaphoresis and malaise/fatigue.  HENT: Positive for congestion.   Eyes: Negative.   Respiratory: Positive for cough and shortness of breath.   Cardiovascular:  Positive for orthopnea, leg swelling and PND.  Gastrointestinal: Negative.   Genitourinary: Negative.   Musculoskeletal: Negative.   Skin: Negative.   Neurological: Positive for weakness.  Endo/Heme/Allergies: Negative.   Psychiatric/Behavioral: Negative.    Blood pressure 135/70, pulse 62, temperature (!) 97.4 F (36.3 C), resp. rate 20, height 6' (1.829 m), weight 73.5 kg, SpO2 99 %. Physical Exam  Nursing note and vitals reviewed. Constitutional: He is oriented to person, place, and time. He appears well-developed and well-nourished.  HENT:  Head: Normocephalic and atraumatic.  Right Ear: External ear normal.  Eyes: Pupils are equal, round, and reactive to light. Conjunctivae and EOM are normal.  Neck: Normal range of motion. Neck supple.  Cardiovascular: Normal rate and regular rhythm.  Murmur heard. Respiratory: Effort normal. He has decreased breath sounds. He has rhonchi. He has rales.  Musculoskeletal: He exhibits edema.  Neurological: He is alert and oriented to person, place, and time. He has normal reflexes.  Skin: Skin is warm and dry.  Psychiatric: He has a normal mood and affect.    Assessment/Plan: Congestive heart failure Shortness of breath Leg edema Acute on chronic renal insufficiency Aortic stenosis Atrial fibrillation paroxysmal Hypoxemia Bilateral pleural effusions History of smoking Cardiomyopathy ejection fraction around 20% Elevated troponin probably demand ischemia . Plan Agree with admit hospital for rule out myocardial infarction Continue to follow-up EKGs and cardiac enzymes Atrial fibrillation paroxysmal continue Eliquis for anticoagulation metoprolol for rate Have the patient patient follow-up  with nephrology for renal insufficiency Supplemental oxygen therapy to help with hypoxemia Advised the patient to refrain from tobacco abuse Recommend patient avoid alcohol consumption Continue heart failure management with IV Lasix beta-blockers ACE inhibitor Do not recommend invasive strategy for demand ischemia    Meriem Lemieux D Allisyn Kunz 01/30/2018, 5:39 PM

## 2018-01-31 LAB — BASIC METABOLIC PANEL
Anion gap: 6 (ref 5–15)
BUN: 29 mg/dL — AB (ref 8–23)
CO2: 38 mmol/L — ABNORMAL HIGH (ref 22–32)
Calcium: 9 mg/dL (ref 8.9–10.3)
Chloride: 99 mmol/L (ref 98–111)
Creatinine, Ser: 1.36 mg/dL — ABNORMAL HIGH (ref 0.61–1.24)
GFR calc Af Amer: 58 mL/min — ABNORMAL LOW (ref >60)
GFR calc non Af Amer: 50 mL/min — ABNORMAL LOW (ref >60)
GLUCOSE: 97 mg/dL (ref 70–99)
Potassium: 4 mmol/L (ref 3.5–5.1)
Sodium: 143 mmol/L (ref 135–145)

## 2018-01-31 MED ORDER — ENSURE ENLIVE PO LIQD
237.0000 mL | Freq: Three times a day (TID) | ORAL | Status: DC
  Administered 2018-01-31 – 2018-02-06 (×10): 237 mL via ORAL

## 2018-01-31 MED ORDER — SODIUM CHLORIDE 0.9 % IV SOLN
1.0000 g | INTRAVENOUS | Status: DC
  Administered 2018-01-31 – 2018-02-02 (×3): 1 g via INTRAVENOUS
  Filled 2018-01-31: qty 1
  Filled 2018-01-31 (×2): qty 10
  Filled 2018-01-31: qty 1

## 2018-01-31 MED ORDER — OCUVITE-LUTEIN PO CAPS
1.0000 | ORAL_CAPSULE | Freq: Every day | ORAL | Status: DC
  Administered 2018-02-01 – 2018-02-06 (×4): 1 via ORAL
  Filled 2018-01-31 (×6): qty 1

## 2018-01-31 NOTE — Progress Notes (Signed)
Initial Nutrition Assessment  DOCUMENTATION CODES:   Severe malnutrition in context of chronic illness  INTERVENTION:  Provide Ensure Enlive po TID, each supplement provides 350 kcal and 20 grams of protein. Patient enjoys all 3 flavors.   Provide Ocuvite daily for wound healing (provides zinc, vitamin A, vitamin C, Vitamin E, copper, and selenium).  Family reports they are going to sign patient up for Meals on Wheels.  NUTRITION DIAGNOSIS:   Severe Malnutrition related to chronic illness(hx EtOH abuse, CHF, CKD) as evidenced by severe fat depletion, severe muscle depletion.  GOAL:   Patient will meet greater than or equal to 90% of their needs  MONITOR:   PO intake, Supplement acceptance, Labs, Weight trends, Skin, I & O's  REASON FOR ASSESSMENT:   Malnutrition Screening Tool    ASSESSMENT:   77 year old male with PMHx of EtOH abuse, aortic stenosis, paroxysmal A-fib, malnutrition, AKI, CHF, CKD stage III who is admitted with acute respiratory failure with hypoxia due to acute on chronic systolic CHF (EF 69%).   Met with patient and family members at bedside. He is known to this RD from an assessment on 05/17/2017. He has had a decreased appetite and intake for years now. Patient is a very poor historian so he is unable to describe what his typical intake is at home. Family is concerned about access to food so they are interested in getting him on the waiting list for Meals on Wheels. Pulled up web site while in room so family could write down number. He enjoys all flavors of Ensure but has not been drinking regularly at home.  UBW was 165 lbs. He was 74.3 kg (163.46 lbs) on 09/06/2014. On 05/18/2017 patient was 67.6 kg (148.72 lbs). He is currently listed as 72.3 kg in chart but unsure if that is accurate.  Medications reviewed and include: Eliquis, Lasix 40 mg BID, lisinopril 20 mg daily, ceftriaxone.  Labs reviewed: CO2 38, BUN 29, Creatinine 1.36.   NUTRITION - FOCUSED  PHYSICAL EXAM:    Most Recent Value  Orbital Region  Severe depletion  Upper Arm Region  Severe depletion  Thoracic and Lumbar Region  Severe depletion  Buccal Region  Severe depletion  Temple Region  Severe depletion  Clavicle Bone Region  Severe depletion  Clavicle and Acromion Bone Region  Severe depletion  Scapular Bone Region  Severe depletion  Dorsal Hand  Severe depletion  Patellar Region  Unable to assess  Anterior Thigh Region  Unable to assess  Posterior Calf Region  Unable to assess  Edema (RD Assessment)  Moderate  Hair  Reviewed  Eyes  Reviewed  Mouth  Unable to assess  Skin  Reviewed  Nails  Reviewed     Diet Order:   Diet Order            Diet 2 gram sodium Room service appropriate? Yes; Fluid consistency: Thin  Diet effective now             EDUCATION NEEDS:   Not appropriate for education at this time  Skin:  Skin Assessment: Skin Integrity Issues: Skin Integrity Issues:: Stage II Stage II: medial coccyx  Last BM:  PTA (01/28/2018 per chart)  Height:   Ht Readings from Last 1 Encounters:  01/29/18 6' (1.829 m)   Weight:   Wt Readings from Last 1 Encounters:  01/31/18 72.3 kg   Ideal Body Weight:  80.9 kg  BMI:  Body mass index is 21.62 kg/m.  Estimated Nutritional Needs:  Kcal:  1800-2100  Protein:  90-100 grams  Fluid:  1.5-1.8 L/day  Willey Blade, MS, RD, LDN Office: [phone] Pager: [phone] After Hours/Weekend Pager: [phone]

## 2018-01-31 NOTE — Progress Notes (Signed)
[facility] [place] at [facility]   PATIENT NAME: Austin Zamora    MR#:  [id number]  DATE OF BIRTH:  12/08/1940  SUBJECTIVE:  CHIEF COMPLAINT: More awake and alert.  Shortness of breath improved and reports he lives with granddaughter ambulate with walker  REVIEW OF SYSTEMS:  CONSTITUTIONAL: No fever, fatigue or weakness.  EYES: No blurred or double vision.  EARS, NOSE, AND THROAT: No tinnitus or ear pain.  RESPIRATORY: No cough, reports improving shortness of breath, wheezing or hemoptysis.  CARDIOVASCULAR: No chest pain, orthopnea, edema.  GASTROINTESTINAL: No nausea, vomiting, diarrhea or abdominal pain.  MUSCULOSKELETAL: No joint pain or arthritis.   NEUROLOGIC: No tingling, numbness, weakness.  PSYCHIATRY: No anxiety or depression.   DRUG ALLERGIES:  No Known Allergies  VITALS:  Blood pressure 110/86, pulse 70, temperature (!) 97.5 F (36.4 C), temperature source Oral, resp. rate 16, height 6' (1.829 m), weight 72.3 kg, SpO2 97 %.  PHYSICAL EXAMINATION:  GENERAL:  77 y.o.-year-old patient lying in the bed with no acute distress.  EYES: Pupils equal, round, reactive to light and accommodation. No scleral icterus. Extraocular muscles intact.  HEENT: Head atraumatic, normocephalic. Oropharynx and nasopharynx clear.  NECK:  Supple, no jugular venous distention. No thyroid enlargement, no tenderness.  LUNGS: Moderate breath sounds bilaterally, no wheezing, minimal rales,rhonchi, no crepitation. No use of accessory muscles of respiration.  CARDIOVASCULAR: S1, S2 normal. No murmurs, rubs, or gallops.  ABDOMEN: Soft, nontender, nondistended. Bowel sounds present. No organomegaly or mass.  EXTREMITIES: 3+ pedal edema, feet are erythematous very feeble pulses, no cyanosis, or clubbing.  NEUROLOGIC: Awake, alert and oriented x3 sensation intact. Gait not checked.  PSYCHIATRIC: The patient is  awake and alert oriented x3 today SKIN: No obvious rash, lesion, or  ulcer.    LABORATORY PANEL:   CBC Recent Labs  Lab 01/30/18 0408  WBC 4.9  HGB 16.3  HCT 52.9*  PLT 163   ------------------------------------------------------------------------------------------------------------------  Chemistries  Recent Labs  Lab 01/30/18 0408 01/31/18 0441  NA 143 143  K 4.7 4.0  CL 103 99  CO2 32 38*  GLUCOSE 109* 97  BUN 28* 29*  CREATININE 1.24 1.36*  CALCIUM 9.0 9.0  MG 1.9  --    ------------------------------------------------------------------------------------------------------------------  Cardiac Enzymes Recent Labs  Lab 01/30/18 2158  TROPONINI 0.15*   ------------------------------------------------------------------------------------------------------------------  RADIOLOGY:  No results found.  EKG:   Orders placed or performed during the hospital encounter of 01/29/18  . ED EKG within 10 minutes  . ED EKG within 10 minutes  . EKG 12-Lead  . 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 CHF protocol, monitor daily weights, intake and output  Lasix 40 mg IV twice daily, continue lisinopril and Lopressor On 3 L of oxygen via nasal cannula will wean off as tolerated [facility]  card[facility] consult.  Discussed with Dr. call wood Keep the legs elevated, teds  Bilateral lower extremity cellulitis with feeble pulses-probably from a component of peripheral arterial disease IV Rocephin Teds Outpatient follow-up with vascular for ABIs and further investigation  Elevated troponin, possible due to demanding ischemia secondary to above. Troponin 0 0.25 ---0.21-0.15 The patient is on Eliquis.  Paroxysmal A. fib.    Rate controlled continue Eliquis and Lopressor.  Hypertension.    Blood pressure stable continue Lopressor and lisinopril.  CKD stage III.  Stable.  Creatinine at 1.34-1.24-1.36  today, at baseline  Generalized  weakness physical therapy assessment    All the records are reviewed and case discussed with Care Management/Social Workerr. Management plans discussed with the patient, family and they are in agreement.  CODE STATUS: DNR  TOTAL TIME TAKING CARE OF THIS PATIENT: 35 minutes.   POSSIBLE D/C IN 2 DAYS, DEPENDING ON CLINICAL CONDITION.  Note: This dictation was prepared with Dragon dictation along with smaller phrase technology. Any transcriptional errors that result from this process are unintentional.   Ramonita Lab M.D on 01/31/2018 at 4:25 PM  Between 7am to 6pm - Pager - [phone] After 6pm go to [email or web address] - password [email or web address] [facility]  [place] [facility]  Office  [phone]  CC: Primary care physician; Alwyn Pea, MD

## 2018-02-01 DIAGNOSIS — I739 Peripheral vascular disease, unspecified: Secondary | ICD-10-CM

## 2018-02-01 DIAGNOSIS — R600 Localized edema: Secondary | ICD-10-CM

## 2018-02-01 DIAGNOSIS — F17210 Nicotine dependence, cigarettes, uncomplicated: Secondary | ICD-10-CM

## 2018-02-01 LAB — CBC
HCT: 51.7 % (ref 39.0–52.0)
Hemoglobin: 15.9 g/dL (ref 13.0–17.0)
MCH: 30.9 pg (ref 26.0–34.0)
MCHC: 30.8 g/dL (ref 30.0–36.0)
MCV: 100.6 fL — AB (ref 80.0–100.0)
Platelets: 149 10*3/uL — ABNORMAL LOW (ref 150–400)
RBC: 5.14 MIL/uL (ref 4.22–5.81)
RDW: 15.9 % — ABNORMAL HIGH (ref 11.5–15.5)
WBC: 4.7 10*3/uL (ref 4.0–10.5)
nRBC: 0 % (ref 0.0–0.2)

## 2018-02-01 LAB — BASIC METABOLIC PANEL
Anion gap: 8 (ref 5–15)
BUN: 29 mg/dL — AB (ref 8–23)
CO2: 38 mmol/L — AB (ref 22–32)
Calcium: 8.6 mg/dL — ABNORMAL LOW (ref 8.9–10.3)
Chloride: 98 mmol/L (ref 98–111)
Creatinine, Ser: 1.35 mg/dL — ABNORMAL HIGH (ref 0.61–1.24)
GFR calc Af Amer: 59 mL/min — ABNORMAL LOW (ref >60)
GFR calc non Af Amer: 51 mL/min — ABNORMAL LOW (ref >60)
Glucose, Bld: 83 mg/dL (ref 70–99)
Potassium: 4.1 mmol/L (ref 3.5–5.1)
Sodium: 144 mmol/L (ref 135–145)

## 2018-02-01 NOTE — Consult Note (Signed)
[facility] Vascular Consult Note  MRN : [id number]  Austin Zamora is a 77 y.o. (1940/03/20) male who presents with chief complaint of  Chief Complaint  Patient presents with  . Shortness of Breath  . Leg Swelling   History of Present Illness:  The patient is a 77 year old male with a past medical history of aortic stenosis, malnutrition, alcohol abuse, paroxysmal atrial fibrillation, acute renal failure who presented to the [facility] emergency department with acute on chronic heart failure.  The patient is a poor historian and most of the information for this consult was obtained by his son who was present in the room during this interview.  The patient is noncompliant with his medication and does not follow-up regularly with his physicians.  The patient currently abuses both tobacco and alcohol.  The patient endorses a past medical history of progressively worsening "weakness" to the bilateral legs.  The patient ambulates with a walker.  At this time, the patient denies any claudication-like symptoms, rest pain or ulcer formation to the bilateral legs.  The patient denies any recent/recurrent bouts of cellulitis.  The patient does endorse a longstanding history of edema to the bilateral legs.  The swelling has progressively worsened over the last few months.  At this time, the patient does not engage in conservative therapy including wearing medical grade 1 compression socks or elevating his legs on a daily basis.  The patient denies any fever, nausea vomiting.  Vascular surgery was consulted by Dr. Amado Coe for further recommendations.  Current Facility-Administered Medications  Medication Dose Route Frequency Provider Last Rate Last Dose  . 0.9 %  sodium chloride infusion  250 mL Intravenous PRN Shaune Pollack, MD      . acetaminophen (TYLENOL) tablet 650 mg  650 mg Oral Q6H PRN Shaune Pollack, MD   650 mg at 01/30/18 1750   Or  . acetaminophen  (TYLENOL) suppository 650 mg  650 mg Rectal Q6H PRN Shaune Pollack, MD      . albuterol (PROVENTIL) (2.5 MG/3ML) 0.083% nebulizer solution 2.5 mg  2.5 mg Nebulization Q2H PRN Shaune Pollack, MD      . apixaban Everlene Balls) tablet 5 mg  5 mg Oral BID Shaune Pollack, MD   5 mg at 02/01/18 0830  . bisacodyl (DULCOLAX) EC tablet 5 mg  5 mg Oral Daily PRN Shaune Pollack, MD      . cefTRIAXone (ROCEPHIN) 1 g in sodium chloride 0.9 % 100 mL IVPB  1 g Intravenous Q24H Gouru, Aruna, MD 200 mL/hr at 01/31/18 1244 1 g at 01/31/18 1244  . feeding supplement (ENSURE ENLIVE) (ENSURE ENLIVE) liquid 237 mL  237 mL Oral TID BM Gouru, Aruna, MD   237 mL at 02/01/18 0830  . furosemide (LASIX) injection 40 mg  40 mg Intravenous BID Shaune Pollack, MD   40 mg at 02/01/18 0830  . HYDROcodone-acetaminophen (NORCO/VICODIN) 5-325 MG per tablet 1-2 tablet  1-2 tablet Oral Q4H PRN Shaune Pollack, MD   1 tablet at 01/31/18 1719  . Influenza vac split quadrivalent PF (FLUZONE HIGH-DOSE) injection 0.5 mL  0.5 mL Intramuscular Tomorrow-1000 Shaune Pollack, MD      . lisinopril (PRINIVIL,ZESTRIL) tablet 20 mg  20 mg Oral Daily Shaune Pollack, MD   20 mg at 02/01/18 0830  . metoprolol succinate (TOPROL-XL) 24 hr tablet 50 mg  50 mg Oral Daily Shaune Pollack, MD   50 mg at 02/01/18 0830  . multivitamin-lutein (OCUVITE-LUTEIN) capsule 1 capsule  1 capsule Oral Daily Gouru, Aruna, MD      . ondansetron (ZOFRAN) tablet 4 mg  4 mg Oral Q6H PRN Shaune Pollackhen, Qing, MD       Or  . ondansetron [facility](ZOFRAN) injection 4 mg  4 mg Intravenous Q6H PRN Shaune Pollackhen, Qing, MD      . senna-docusate (Senokot-S) tablet 1 tablet  1 tablet Oral QHS PRN Shaune Pollackhen, Qing, MD      . sodium chloride flush (NS) 0.9 % injection 3 mL  3 mL Intravenous Q12H Shaune Pollackhen, Qing, MD   3 mL at 02/01/18 0831  . sodium chloride flush (NS) 0.9 % injection 3 mL  3 mL Intravenous PRN Shaune Pollackhen, Qing, MD   3 mL at 01/31/18 1720   Past Medical History:  Diagnosis Date  . Acute kidney injury (HCC)   . Acute renal failure (ARF) (HCC)   .  Alcohol abuse   . Aortic stenosis   . Nonrheumatic aortic valve stenosis   . Paroxysmal A-fib (HCC)   . Protein calorie malnutrition (HCC)   . SOBOE (shortness of breath on exertion)    Past Surgical History:  Procedure Laterality Date  . RIGHT/LEFT HEART CATH AND CORONARY ANGIOGRAPHY Bilateral 09/22/2017   Procedure: RIGHT/LEFT HEART CATH AND CORONARY ANGIOGRAPHY;  Surgeon: Lamar BlinksKowalski, Bruce J, MD;  Location: [facility] INVASIVE CV LAB;  Service: Cardiovascular;  Laterality: Bilateral;   Social History Social History   Tobacco Use  . Smoking status: Former Smoker    Packs/day: 0.50    Years: 60.00    Pack years: 30.00    Types: Cigarettes  . Smokeless tobacco: Never Used  . Tobacco comment: quit briefly in march  Substance Use Topics  . Alcohol use: Yes    Alcohol/week: 2.0 standard drinks    Types: 2 Cans of beer per week    Comment: Drinks two 40's daily  . Drug use: No   Family History Family History  Problem Relation Age of Onset  . Breast cancer Mother   . Parkinson's disease Father   Patient denies any family history of peripheral artery disease, venous disease or bleeding/clotting disorders.  No Known Allergies   REVIEW OF SYSTEMS (Negative unless checked)  Constitutional: [] Weight loss  [] Fever  [] Chills Cardiac: [] Chest pain   [] Chest pressure   [] Palpitations   [x] Shortness of breath when laying flat   [x] Shortness of breath at rest   [x] Shortness of breath with exertion. Vascular:  [] Pain in legs with walking   [] Pain in legs at rest   [] Pain in legs when laying flat   [] Claudication   [] Pain in feet when walking  [] Pain in feet at rest  [] Pain in feet when laying flat   [] History of DVT   [] Phlebitis   [] Swelling in legs   [] Varicose veins   [] Non-healing ulcers Pulmonary:   [] Uses home oxygen   [] Productive cough   [] Hemoptysis   [] Wheeze  [x] COPD   [] Asthma Neurologic:  [] Dizziness  [] Blackouts   [] Seizures   [] History of stroke   [] History of TIA  [] Aphasia    [] Temporary blindness   [] Dysphagia   [] Weakness or numbness in arms   [x] Weakness or numbness in legs Musculoskeletal:  [] Arthritis   [] Joint swelling   [] Joint pain   [] Low back pain Hematologic:  [] Easy bruising  [] Easy bleeding   [] Hypercoagulable state   [] Anemic  [] Hepatitis Gastrointestinal:  [] Blood in stool   [] Vomiting blood  [] Gastroesophageal reflux/heartburn   [] Difficulty swallowing. Genitourinary:  [x] Chronic kidney disease   []   Difficult urination  [] Frequent urination  [] Burning with urination   [] Blood in urine Skin:  [] Rashes   [] Ulcers   [] Wounds Psychological:  [] History of anxiety   []  History of major depression.  Physical Examination  Vitals:   02/01/18 0000 02/01/18 0006 02/01/18 0405 02/01/18 0826  BP:   124/84 (!) 141/73  Pulse:   77 62  Resp:   20 14  Temp:   98.3 F (36.8 C) 97.6 F (36.4 C)  TempSrc:      SpO2: (!) 88% 93%  95%  Weight:   72.4 kg   Height:       Body mass index is 21.65 kg/m. Gen:  WD/WN, NAD Head: [place]/AT, No temporalis wasting. Prominent temp pulse not noted. Ear/Nose/Throat: Hearing grossly intact, nares w/o erythema or drainage, oropharynx w/o Erythema/Exudate Eyes: Sclera non-icteric, conjunctiva clear Neck: Trachea midline.  No JVD.  Pulmonary:  Good air movement, respirations not labored, decreased bilaterally.  Cardiac: Irregularly irregular Vascular: Vessel Right Left  Radial Palpable Palpable  Ulnar Palpable Palpable  Brachial Palpable Palpable  Carotid Palpable, without bruit Palpable, without bruit  Aorta Not palpable N/A  Femoral Palpable Palpable  Popliteal Palpable Palpable  PT Non-Palpable Non-Palpable  DP Non-Palpable Non-Palpable   Right Lower Extremity: Moderate 2+ pitting edema noted bilaterally.  TED hose are in place.  Hard to palpate pedal pulses however foot is warm.  There is a relatively normal capillary refill.  No ulcerations are noted.  Mild stasis dermatitis noted.  There is no acute vascular  compromise noted on exam.  Left Lower Extremity: Moderate 2+ pitting edema noted bilaterally.  TED hose are in place.  Hard to palpate pedal pulses however foot is warm.  There is a relatively normal capillary refill.  No ulcerations are noted.  Mild stasis dermatitis noted.  There is no acute vascular compromise noted on exam.  Gastrointestinal: soft, non-tender/non-distended. No guarding/reflex.  Musculoskeletal: M/S 5/5 throughout.   Neurologic: Sensation grossly intact in extremities.  Symmetrical.  Speech is fluent. Motor exam as listed above. Psychiatric: Judgment intact, Mood & affect appropriate for pt's clinical situation. Dermatologic: No rashes or ulcers noted.  No cellulitis or open wounds. Lymph : No Cervical, Axillary, or Inguinal lymphadenopathy.  CBC Lab Results  Component Value Date   WBC 4.7 02/01/2018   HGB 15.9 02/01/2018   HCT 51.7 02/01/2018   MCV 100.6 (H) 02/01/2018   PLT 149 (L) 02/01/2018   BMET    Component Value Date/Time   NA 144 02/01/2018 0424   K 4.1 02/01/2018 0424   CL 98 02/01/2018 0424   CO2 38 (H) 02/01/2018 0424   GLUCOSE 83 02/01/2018 0424   BUN 29 (H) 02/01/2018 0424   CREATININE 1.35 (H) 02/01/2018 0424   CALCIUM 8.6 (L) 02/01/2018 0424   GFRNONAA 51 (L) 02/01/2018 0424   GFRAA 59 (L) 02/01/2018 0424   Estimated Creatinine Clearance: 47.7 mL/min (A) (by C-G formula based on SCr of 1.35 mg/dL (H)).  COAG No results found for: INR, PROTIME  Radiology Dg Chest 2 View  Result Date: 01/29/2018 CLINICAL DATA:  Lower extremity swelling which shortness-of-breath. Possible gentle infection. EXAM: CHEST - 2 VIEW COMPARISON:  05/18/2017 FINDINGS: Lungs are adequately inflated demonstrate a moderate size right pleural effusion which is worse with small left pleural effusion which is worse. There is likely bibasilar associated compressive atelectasis. Infection in the lung bases is possible. Remainder of the exam is unchanged. IMPRESSION:  Worsening bilateral pleural effusions right greater than  left likely with associated bibasilar atelectasis. Infection in the lung bases is possible. Electronically Signed   By: Elberta Fortis M.D.   On: 01/29/2018 16:11   Assessment/Plan The patient is a 77 year old male with a past medical history of aortic stenosis, malnutrition, alcohol abuse, paroxysmal atrial fibrillation, acute renal failure who presented to the [facility] emergency department with acute on chronic heart failure. 1. Peripheral Artery Disease: Patient with multiple risk factors for peripheral artery disease.  Hard to palpate pedal pulses on exam most likely due to the amount of edema however his bilateral feet are warm.  Currently, the patient is asymptomatic.  Recommend outpatient follow-up with ABI/arterial ultrasound to assess the degree of peripheral artery disease 2. Lower Extremity Edema: The patient has multiple risk factors affecting his lower extremity edema such as acute on chronic failure and chronic kidney disease.  At this time, recommend wearing medical grade 1 compression socks placed in the a.m. and remove in the p.m. daily.  He patient should elevate his legs heart level or higher as much as possible.  We would be happy to see the patient in the outpatient setting and have him undergo a bilateral lower extremity venous duplex to rule out any contributing venous ulcers lymphatic disease. 3. Tobacco Abuse: We had a discussion for approximately five minutes regarding the absolute need for smoking cessation due to the deleterious nature of tobacco on the vascular system. We discussed the tobacco use would diminish patency of any intervention, and likely significantly worsen progressio of disease. We discussed multiple agents for quitting including replacement therapy or medications to reduce cravings such as Chantix. The patient voices their understanding of the importance of smoking  cessation.  Discussed with Dr. Weldon Inches, PA-C  02/01/2018 1:34 PM  This note was created with Dragon medical transcription system.  Any error is purely unintentional.

## 2018-02-01 NOTE — Plan of Care (Signed)
  Problem: Clinical Measurements: Goal: Respiratory complications will improve Outcome: Not Progressing Note:  Requiring 2L    Problem: Activity: Goal: Risk for activity intolerance will decrease Outcome: Not Progressing Note:  Still very weak; requiring +2 assist.  Physical Therapy to work with patient.

## 2018-02-01 NOTE — Evaluation (Signed)
Physical Therapy Evaluation Patient Details Name: Austin Zamora MRN: [id number] DOB: 12/01/1940 Today's Date: 02/01/2018   History of Present Illness  77 yo male with onset of acute CHF was admitted, referred to PT for mobility and note edema on feet, generalized weakness and AKI, ARF, elevated troponin and demand ischemia.  Has atelectasis, pleural effusion, on O2 3L with fatigue of effort, BLE cellulitis.  PMHx:  EF 20%, CHF, PAD, CKD 3, EtOH abuse, aortic stenosis, SOB with exertion, PAF, HTN,   Clinical Impression  Pt was seen for mobility and with care given his edematous and irritated LE's.  Pt was able to let PT assist him to the chair, and then he was seen for positioning to protect his spine and maximize his use of limbs for posture and ADL's..  Pt is expecting to go home but talked with him about SNF option as he is unsafe and having low endurance to stand.  Follow up with appropriate interventions.       Follow Up Recommendations SNF    Equipment Recommendations  Rolling walker with 5" wheels    Recommendations for Other Services       Precautions / Restrictions Precautions Precautions: Fall(telemetry) Precaution Comments: poor awareness of his legs and feet to stand effectively Restrictions Weight Bearing Restrictions: No      Mobility  Bed Mobility Overal bed mobility: Needs Assistance Bed Mobility: Supine to Sit;Sit to Supine     Supine to sit: Mod assist Sit to supine: Mod assist      Transfers Overall transfer level: Needs assistance Equipment used: Rolling walker (2 wheeled);1 person hand held assist Transfers: Sit to/from Stand Sit to Stand: Mod assist         General transfer comment: mod assist from lower heights and with bed elevated was min asssit  Ambulation/Gait Ambulation/Gait assistance: Min assist Gait Distance (Feet): 4 Feet Assistive device: Rolling walker (2 wheeled);1 person hand held assist Gait Pattern/deviations: Step-through  pattern;Step-to pattern;Decreased stride length;Wide base of support;Trunk flexed Gait velocity: reduced Gait velocity interpretation: <1.8 ft/sec, indicate of risk for recurrent falls    Stairs            Wheelchair Mobility    Modified Rankin (Stroke Patients Only)       Balance Overall balance assessment: Needs assistance;History of Falls Sitting-balance support: Feet supported;Bilateral upper extremity supported Sitting balance-Leahy Scale: Fair     Standing balance support: Bilateral upper extremity supported;During functional activity Standing balance-Leahy Scale: Poor                               Pertinent Vitals/Pain Pain Assessment: No/denies pain    Home Living Family/patient expects to be discharged to:: Private residence Living Arrangements: Other relatives Available Help at Discharge: Family;Available PRN/intermittently Type of Home: House Home Access: Stairs to enter Entrance Stairs-Rails: None Entrance Stairs-Number of Steps: 3 steps in back without rails, 10 steps in front with railings Home Layout: One level Home Equipment: Cane - single point;Walker - 2 wheels Additional Comments: has limited endurance for standing    Prior Function Level of Independence: Independent with assistive device(s)         Comments: reports recent fall last week due to weakness     Hand Dominance   Dominant Hand: Right    Extremity/Trunk Assessment   Upper Extremity Assessment Upper Extremity Assessment: Generalized weakness    Lower Extremity Assessment Lower Extremity Assessment: Generalized weakness  Cervical / Trunk Assessment Cervical / Trunk Assessment: Kyphotic  Communication   Communication: No difficulties  Cognition Arousal/Alertness: Awake/alert Behavior During Therapy: WFL for tasks assessed/performed Overall Cognitive Status: Within Functional Limits for tasks assessed                                         General Comments General comments (skin integrity, edema, etc.): pt is having irritation on back with skin sensitive     Exercises     Assessment/Plan    PT Assessment Patient needs continued PT services  PT Problem List Decreased strength;Decreased activity tolerance;Decreased range of motion;Decreased balance;Decreased mobility;Decreased coordination;Decreased cognition;Decreased knowledge of use of DME;Decreased safety awareness;Decreased knowledge of precautions;Obesity;Decreased skin integrity       PT Treatment Interventions DME instruction;Gait training;Stair training;Functional mobility training;Therapeutic activities;Therapeutic exercise;Balance training;Neuromuscular re-education;Patient/family education    PT Goals (Current goals can be found in the Care Plan section)  Acute Rehab PT Goals Patient Stated Goal: to get home and feel better PT Goal Formulation: With patient/family Time For Goal Achievement: 02/15/18 Potential to Achieve Goals: Good    Frequency Min 2X/week   Barriers to discharge Inaccessible home environment;Decreased caregiver support home alone with three steps and no railing    Co-evaluation               AM-PAC PT "6 Clicks" Mobility  Outcome Measure Help needed turning from your back to your side while in a flat bed without using bedrails?: A Little Help needed moving from lying on your back to sitting on the side of a flat bed without using bedrails?: A Little Help needed moving to and from a bed to a chair (including a wheelchair)?: A Little Help needed standing up from a chair using your arms (e.g., wheelchair or bedside chair)?: A Little Help needed to walk in hospital room?: A Lot Help needed climbing 3-5 steps with a railing? : Total 6 Click Score: 15    End of Session Equipment Utilized During Treatment: Gait belt Activity Tolerance: Patient limited by fatigue;Treatment limited secondary to medical complications  (Comment) Patient left: with call bell/phone within reach;in chair;with family/visitor present Nurse Communication: Mobility status PT Visit Diagnosis: Unsteadiness on feet (R26.81);Muscle weakness (generalized) (M62.81);Difficulty in walking, not elsewhere classified (R26.2)    Time: 2130-8657 PT Time Calculation (min) (ACUTE ONLY): 35 min   Charges:   PT Evaluation $PT Eval Moderate Complexity: 1 Mod PT Treatments $Therapeutic Activity: 8-22 mins       Ivar Drape 02/01/2018, 6:00 PM   Samul Dada, PT MS Acute Rehab Dept. Number: [facility] [phone] and [facility] [phone]

## 2018-02-01 NOTE — Discharge Instructions (Signed)
Patient should wear medical grade one compression (20-2830mmhg) socks on a daily basis. Place stocks to the legs in the AM and take them off in the PM. No need to sleep in compression socks. Proper elevation is heart level or higher as much as possible even during the day.

## 2018-02-01 NOTE — Care Management Note (Signed)
Case Management Note  Patient Details  Name: Austin Zamora MRN: [id number] Date of Birth: 07/25/1940  Subjective/Objective:    Patient is from home.  Granddaughter lives with patient.  Son, Austin Zamora is at bedside.  Patient is oriented X1.  Austin Zamora explains that his dad has not been eating or taking his medications like he should.  Around one month ago he was independent at home.  He is not sure if he has a functioning scale at home but will be sure he gets one.  [facility]  Current with Dr. Hilbert CorriganSiefferman with [facility].  He uses a walker at home.  Currently on 2L acute oxygen.  Has used [facility] in the past.  Son, Austin Zamora would prefer patient to discharge to SNF for short term rehab to hopefully return to baseline and move in with him and his wife.  Austin Zamora, CSW made aware of son's wishes.  PT note pending.                   Action/Plan:   Expected Discharge Date:                  Expected Discharge Plan:  Skilled Nursing Facility  In-House Referral:     Discharge planning Services  CM Consult  Post Acute Care Choice:    Choice offered to:     DME Arranged:    DME Agency:     HH Arranged:    HH Agency:     Status of Service:  In process, will continue to follow  If discussed at Long Length of Stay Meetings, dates discussed:    Additional Comments:  Sherren KernsJennifer L Deldrick Linch, RN 02/01/2018, 12:10 PM

## 2018-02-01 NOTE — Progress Notes (Addendum)
[facility] - [place] at [facility]   PATIENT NAME: Austin Zamora    MR#:  [id number]  DATE OF BIRTH:  1940/05/10  SUBJECTIVE:  CHIEF COMPLAINT:  Shortness of breath improved and feet are red and swollen.  Son at bedside, patient reports he lives with granddaughter ambulate with walker.  Son is requesting patient to go to SNF, and comfortable to take care of him at home even with home health patient is agreeable  REVIEW OF SYSTEMS:  CONSTITUTIONAL: No fever, fatigue or weakness.  EYES: No blurred or double vision.  EARS, NOSE, AND THROAT: No tinnitus or ear pain.  RESPIRATORY: No cough, reports improving shortness of breath, wheezing or hemoptysis.  CARDIOVASCULAR: No chest pain, orthopnea, edema.  GASTROINTESTINAL: No nausea, vomiting, diarrhea or abdominal pain.  MUSCULOSKELETAL: No joint pain or arthritis.   NEUROLOGIC: No tingling, numbness, weakness.  PSYCHIATRY: No anxiety or depression.   DRUG ALLERGIES:  No Known Allergies  VITALS:  Blood pressure (!) 141/73, pulse 62, temperature 97.6 F (36.4 C), resp. rate 14, height 6' (1.829 m), weight 72.4 kg, SpO2 95 %.  PHYSICAL EXAMINATION:  GENERAL:  77 y.o.-year-old patient lying in the bed with no acute distress.  EYES: Pupils equal, round, reactive to light and accommodation. No scleral icterus. Extraocular muscles intact.  HEENT: Head atraumatic, normocephalic. Oropharynx and nasopharynx clear.  NECK:  Supple, no jugular venous distention. No thyroid enlargement, no tenderness.  LUNGS: Moderate breath sounds bilaterally, no wheezing, minimal rales,rhonchi, no crepitation. No use of accessory muscles of respiration.  CARDIOVASCULAR: S1, S2 normal. No murmurs, rubs, or gallops.  ABDOMEN: Soft, nontender, nondistended. Bowel sounds present. No organomegaly or mass.  EXTREMITIES: 3+ pedal edema, feet are erythematous very feeble pulses, no cyanosis, or clubbing.  NEUROLOGIC: Awake, alert and oriented  x2sensation intact. Gait not checked.  PSYCHIATRIC: The patient is  awake and alert oriented x2 today SKIN: No obvious rash, lesion, or ulcer.    LABORATORY PANEL:   CBC Recent Labs  Lab 02/01/18 0424  WBC 4.7  HGB 15.9  HCT 51.7  PLT 149*   ------------------------------------------------------------------------------------------------------------------  Chemistries  Recent Labs  Lab 01/30/18 0408  02/01/18 0424  NA 143   < > 144  K 4.7   < > 4.1  CL 103   < > 98  CO2 32   < > 38*  GLUCOSE 109*   < > 83  BUN 28*   < > 29*  CREATININE 1.24   < > 1.35*  CALCIUM 9.0   < > 8.6*  MG 1.9  --   --    < > = values in this interval not displayed.   ------------------------------------------------------------------------------------------------------------------  Cardiac Enzymes Recent Labs  Lab 01/30/18 2158  TROPONINI 0.15*   ------------------------------------------------------------------------------------------------------------------  RADIOLOGY:  No results found.  EKG:   Orders placed or performed during the hospital encounter of 01/29/18  . ED EKG within 10 minutes  . ED EKG within 10 minutes  . EKG 12-Lead  . 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 CHF protocol, monitor daily weights, intake and output  Lasix 40 mg IV twice daily Negative output -4.3 L from admission continue lisinopril and Lopressor On 3 L of oxygen via nasal cannula will wean off as tolerated [facility]  cardiology consult.  Discussed with Dr. call wood Keep the legs elevated, teds  Bilateral  lower extremity cellulitis with feeble pulses-probably from a component of peripheral arterial disease IV Rocephin Teds Outpatient follow-up with vascular for ABIs and further investigation as recommended by vascular surgery  Elevated troponin, possible due to demanding ischemia secondary  to above. Troponin 0 0.25 ---0.21-0.15 The patient is on Eliquis.  Paroxysmal A. fib.    Rate controlled continue Eliquis and Lopressor.  Hypertension.    Blood pressure stable continue Lopressor and lisinopril.  CKD stage III.  Stable.  Creatinine at 1.34-1.24-1.36-1.35 today, at baseline  Generalized weakness physical therapy assessment- Son is requesting skilled nursing facility as they cannot take care of him at home with home health Follow-up with case management and social worker    All the records are reviewed and case discussed with Care Management/Social Workerr. Management plans discussed with the patient, son Austin Zamora at bedside and they are in agreement.  CODE STATUS: DNR  TOTAL TIME TAKING CARE OF THIS PATIENT: 35 minutes.   POSSIBLE D/C IN 2 DAYS, DEPENDING ON CLINICAL CONDITION.  Note: This dictation was prepared with Dragon dictation along with smaller phrase technology. Any transcriptional errors that result from this process are unintentional.   Austin Zamora M.D on 02/01/2018 at 3:06 PM  Between 7am to 6pm - Pager - [phone] After 6pm go to [email or web address] - password [email or web address] [facility]  [facility]  Office  [phone]  CC: Primary care physician; Alwyn Peaallwood, Dwayne D, MD

## 2018-02-01 NOTE — Care Management Important Message (Signed)
Copy of signed IM left with patient in room.  

## 2018-02-02 LAB — HEMOGLOBIN AND HEMATOCRIT, BLOOD
HCT: 47.7 % (ref 39.0–52.0)
HCT: 43.9 % (ref 39.0–52.0)
HEMOGLOBIN: 14 g/dL (ref 13.0–17.0)
Hemoglobin: 15 g/dL (ref 13.0–17.0)

## 2018-02-02 LAB — CBC
HCT: 53.1 % — ABNORMAL HIGH (ref 39.0–52.0)
Hemoglobin: 16.7 g/dL (ref 13.0–17.0)
MCH: 31.3 pg (ref 26.0–34.0)
MCHC: 31.5 g/dL (ref 30.0–36.0)
MCV: 99.6 fL (ref 80.0–100.0)
NRBC: 0 % (ref 0.0–0.2)
Platelets: 166 10*3/uL (ref 150–400)
RBC: 5.33 MIL/uL (ref 4.22–5.81)
RDW: 15.9 % — AB (ref 11.5–15.5)
WBC: 4.5 10*3/uL (ref 4.0–10.5)

## 2018-02-02 LAB — BASIC METABOLIC PANEL
Anion gap: 7 (ref 5–15)
BUN: 31 mg/dL — ABNORMAL HIGH (ref 8–23)
CO2: 42 mmol/L — ABNORMAL HIGH (ref 22–32)
Calcium: 8.8 mg/dL — ABNORMAL LOW (ref 8.9–10.3)
Chloride: 92 mmol/L — ABNORMAL LOW (ref 98–111)
Creatinine, Ser: 1.16 mg/dL (ref 0.61–1.24)
GFR calc Af Amer: >60 mL/min (ref >60)
GFR calc non Af Amer: >60 mL/min (ref >60)
Glucose, Bld: 97 mg/dL (ref 70–99)
Potassium: 3.8 mmol/L (ref 3.5–5.1)
Sodium: 141 mmol/L (ref 135–145)

## 2018-02-02 NOTE — Clinical Social Work Note (Signed)
Clinical Social Work Assessment  Patient Details  Name: Austin Zamora MRN: [id number] Date of Birth: 09/09/1940  Date of referral:  02/02/18               Reason for consult:  Facility Placement                Permission sought to share information with:  Family Supports, Magazine features editoracility Contact Representative Permission granted to share information::  Yes, Verbal Permission Granted  Name::     Shela LeffLewis, Chevelle Durr Son [phone]  [phone] or Lorusso,Carolyn Sister [phone]   Agency::  SNF admissions  Relationship::     Contact Information:     Housing/Transportation Living arrangements for the past 2 months:  Single Family Home Source of Information:  Patient, Adult Children, Medical Team Patient Interpreter Needed:  None Criminal Activity/Legal Involvement Pertinent to Current Situation/Hospitalization:  No - Comment as needed Significant Relationships:  Adult Children Lives with:  Self Do you feel safe going back to the place where you live?  No Need for family participation in patient care:  Yes (Comment)  Care giving concerns:  Patient and son feel that he needs som short term rehab before he is able to return back home.   Social Worker assessment / plan:  Patient is a 77 year old male who is alert and oriented x3.  Patient was sleepy, CSW spoke to patient's son who was at bedside to complete assessment.  Patient lives alone, his daughter died in May, per patient's son since his daughter died patient seems to have given up, not taking his medications and not always eating.  Per patient's son, his niece leaves in the home, but has not been assisting with taking care of patient.  Patient has never been to rehab before, CSW explained to patient and his son the process for looking for placement, and what to expect at SNF.  Patient and his son were explained how insurance will pay for stay and what the process is for insurance authorization.  Patient and his son did not express any other questions  and gave CSW permission to [place].   Employment status:  Retired Database administratornsurance information:  Managed Medicare PT Recommendations:  Skilled Nursing Facility Information / Referral to community resources:  Skilled Nursing Facility  Patient/Family's Response to care:  Patient and son are in agreement to going to SNF for short term rehab.  Patient/Family's Understanding of and Emotional Response to Diagnosis, Current Treatment, and Prognosis:  Patient and son are hopeful that patient will not have to be in SNF for very long.  Emotional Assessment Appearance:  Appears stated age Attitude/Demeanor/Rapport:    Affect (typically observed):  Appropriate, Stable Orientation:  Oriented to Self, Oriented to Situation, Oriented to Place Alcohol / Substance use:  Alcohol Use Psych involvement (Current and /or in the community):  No (Comment)  Discharge Needs  Concerns to be addressed:  Lack of Support, Medication Concerns, Care Coordination Readmission within the last 30 days:  No Current discharge risk:  Lack of support system, Lives alone Barriers to Discharge:  English as a second language teachernsurance Authorization, Continued Medical Work up   [place] Const[place], Rochanda Harpham R, LCSWA 02/02/2018, 5:54 PM

## 2018-02-02 NOTE — Progress Notes (Signed)
Physical Therapy Treatment Patient Details Name: Austin Zamora MRN: [id number] DOB: 03/07/1940 Today's Date: 02/02/2018    History of Present Illness 77 yo male with onset of acute CHF was admitted, referred to PT for mobility and note edema on feet, generalized weakness and AKI, ARF, elevated troponin and demand ischemia.  Has atelectasis, pleural effusion, on O2 3L with fatigue of effort, BLE cellulitis.  PMHx:  EF 20%, CHF, PAD, CKD 3, EtOH abuse, aortic stenosis, SOB with exertion, PAF, HTN,     PT Comments    Pt is motivated to get OOB and was able to be assisted with some fatigue with effort.  Continue on with his treatment and focus on rehab with continuation of acute therapy for strength and balance, encouraging pt to be OOB in chair.   Follow Up Recommendations  SNF     Equipment Recommendations  Rolling walker with 5" wheels    Recommendations for Other Services       Precautions / Restrictions Precautions Precautions: Fall Precaution Comments: telemetry Restrictions Weight Bearing Restrictions: No    Mobility  Bed Mobility Overal bed mobility: Needs Assistance Bed Mobility: Supine to Sit     Supine to sit: Mod assist     General bed mobility comments: assisted to side of bed with help to move legs then support trunk to sit up  Transfers Overall transfer level: Needs assistance Equipment used: Rolling walker (2 wheeled);1 person hand held assist   Sit to Stand: Mod assist         General transfer comment: requires more cues today to set up  Ambulation/Gait Ambulation/Gait assistance: Min assist Gait Distance (Feet): 4 Feet Assistive device: Rolling walker (2 wheeled);1 person hand held assist Gait Pattern/deviations: Step-to pattern;Shuffle;Decreased stride length;Wide base of support Gait velocity: reduced Gait velocity interpretation: <1.8 ft/sec, indicate of risk for recurrent falls     Stairs             Wheelchair Mobility     Modified Rankin (Stroke Patients Only)       Balance Overall balance assessment: Needs assistance;History of Falls Sitting-balance support: Feet supported;Bilateral upper extremity supported Sitting balance-Leahy Scale: Fair     Standing balance support: Bilateral upper extremity supported;During functional activity Standing balance-Leahy Scale: Poor                              Cognition Arousal/Alertness: Awake/alert Behavior During Therapy: Flat affect Overall Cognitive Status: No family/caregiver present to determine baseline cognitive functioning Area of Impairment: Memory                     Memory: Decreased short-term memory         General Comments: pt is having low O2 sats but in low 90's      Exercises      General Comments General comments (skin integrity, edema, etc.): back is itching but not as severe      Pertinent Vitals/Pain Pain Assessment: No/denies pain    Home Living                      Prior Function            PT Goals (current goals can now be found in the care plan section) Acute Rehab PT Goals Patient Stated Goal: to get breakfast and to get home Progress towards PT goals: Progressing toward goals    Frequency  Min 2X/week      PT Plan      Co-evaluation              AM-PAC PT "6 Clicks" Mobility   Outcome Measure  Help needed turning from your back to your side while in a flat bed without using bedrails?: A Little Help needed moving from lying on your back to sitting on the side of a flat bed without using bedrails?: A Little Help needed moving to and from a bed to a chair (including a wheelchair)?: A Little Help needed standing up from a chair using your arms (e.g., wheelchair or bedside chair)?: A Lot Help needed to walk in hospital room?: A Lot Help needed climbing 3-5 steps with a railing? : Total 6 Click Score: 14    End of Session Equipment Utilized During Treatment:  Gait belt;Oxygen Activity Tolerance: Patient limited by fatigue;Treatment limited secondary to medical complications (Comment) Patient left: in chair;with call bell/phone within reach;with chair alarm set Nurse Communication: Mobility status;Other (comment) PT Visit Diagnosis: Unsteadiness on feet (R26.81);Muscle weakness (generalized) (M62.81);Difficulty in walking, not elsewhere classified (R26.2)     Time: [id number] PT Time Calculation (min) (ACUTE ONLY): 24 min  Charges:  $Gait Training: 8-22 mins $Therapeutic Activity: 8-22 mins                    Ivar DrapeRuth E Elianys Conry 02/02/2018, 9:42 AM   Samul Dadauth Stace Peace, PT MS Acute Rehab Dept. Number: [facility] [phone] and [facility] [phone]

## 2018-02-02 NOTE — Progress Notes (Signed)
Patient had bright red bloody appearing stool and urine this morning upon assessment. MD notified, eliquis held/not administered, CBC ordered.

## 2018-02-02 NOTE — Clinical Social Work Note (Signed)
CSW presented bed offers to patient's son Milly Jakobric Connelley, [phone],  and he chose [facility] [place].  CSW contacted [facility] [place], and they can accept patient once insurance authorization has been received.  [facility] [place] [facility] will begin insurance authorization, CSW to continue to follow patient's progress throughout discharge planning.  Ervin KnackEric R. Jacquez Sheetz, MSW, Theresia MajorsLCSWA [phone]  02/02/2018 5:50 PM

## 2018-02-02 NOTE — Progress Notes (Signed)
[facility] - [place] at [facility]   PATIENT NAME: Austin Zamora    MR#:  [id number]  DATE OF BIRTH:  1940-12-08  SUBJECTIVE:  CHIEF COMPLAINT: Today staff has noticed fresh blood in the stool and hematuria.  Patient denies any abdominal pain nausea or vomiting .  Patient wants to go home, son is requesting patient to go to SNF, and uncomfortable to take care of him at home even with home health, patient is agreeable  REVIEW OF SYSTEMS:  CONSTITUTIONAL: No fever, fatigue or weakness.  EYES: No blurred or double vision.  EARS, NOSE, AND THROAT: No tinnitus or ear pain.  RESPIRATORY: No cough, reports improving shortness of breath, wheezing or hemoptysis.  CARDIOVASCULAR: No chest pain, orthopnea, edema.  GASTROINTESTINAL: No nausea, vomiting, diarrhea or abdominal pain.  MUSCULOSKELETAL: No joint pain or arthritis.   NEUROLOGIC: No tingling, numbness, weakness.  PSYCHIATRY: No anxiety or depression.   DRUG ALLERGIES:  No Known Allergies  VITALS:  Blood pressure 109/64, pulse 88, temperature 97.7 F (36.5 C), temperature source Oral, resp. rate 19, height 6' (1.829 m), weight 73 kg, SpO2 90 %.  PHYSICAL EXAMINATION:  GENERAL:  77 y.o.-year-old patient lying in the bed with no acute distress.  EYES: Pupils equal, round, reactive to light and accommodation. No scleral icterus. Extraocular muscles intact.  HEENT: Head atraumatic, normocephalic. Oropharynx and nasopharynx clear.  NECK:  Supple, no jugular venous distention. No thyroid enlargement, no tenderness.  LUNGS: Moderate breath sounds bilaterally, no wheezing, minimal rales,rhonchi, no crepitation. No use of accessory muscles of respiration.  CARDIOVASCULAR: S1, S2 normal. No murmurs, rubs, or gallops.  ABDOMEN: Soft, nontender, nondistended. Bowel sounds present. No organomegaly or mass.  EXTREMITIES: 3+ pedal edema, feet are erythematous very feeble pulses, no cyanosis, or clubbing.  NEUROLOGIC: Awake,  alert and oriented x2sensation intact. Gait not checked.  PSYCHIATRIC: The patient is  awake and alert oriented x2 today SKIN: No obvious rash, lesion, or ulcer.    LABORATORY PANEL:   CBC Recent Labs  Lab 02/02/18 1028 02/02/18 1608  WBC 4.5  --   HGB 16.7 15.0  HCT 53.1* 47.7  PLT 166  --    ------------------------------------------------------------------------------------------------------------------  Chemistries  Recent Labs  Lab 01/30/18 0408  02/02/18 0426  NA 143   < > 141  K 4.7   < > 3.8  CL 103   < > 92*  CO2 32   < > 42*  GLUCOSE 109*   < > 97  BUN 28*   < > 31*  CREATININE 1.24   < > 1.16  CALCIUM 9.0   < > 8.8*  MG 1.9  --   --    < > = values in this interval not displayed.   ------------------------------------------------------------------------------------------------------------------  Cardiac Enzymes Recent Labs  Lab 01/30/18 2158  TROPONINI 0.15*   ------------------------------------------------------------------------------------------------------------------  RADIOLOGY:  No results found.  EKG:   Orders placed or performed during the hospital encounter of 01/29/18  . ED EKG within 10 minutes  . ED EKG within 10 minutes  . EKG 12-Lead  . EKG 12-Lead    ASSESSMENT AND PLAN:   GI bleed and hematuria Discontinued Eliquis for now Monitor hemoglobin hematocrit closely Patient is hemodynamically stable at this time with hemoglobin of 15.9-16.7-15.0 If consistently bleeding will put a GI consult tomorrow   Acute respiratory failure with hypoxia due to acute on chronic systolic CHF with ejection fraction 20%. CHF protocol, monitor daily weights, intake and output  Lasix 40 mg IV twice daily Negative output - 8.1 L from admission continue lisinopril and Lopressor On 3 L of oxygen via nasal cannula will wean off as tolerated [facility]  cardiology consult.  Discussed with Dr. call wood Keep the legs elevated, teds  Bilateral lower  extremity cellulitis with feeble pulses-probably from a component of peripheral arterial disease IV Rocephin Teds Outpatient follow-up with vascular for ABIs and further investigation as recommended by vascular surgery  Elevated troponin, possible due to demanding ischemia secondary to above. Troponin 0 0.25 ---0.21-0.15 The patient is on Eliquis.  Paroxysmal A. fib.    Rate controlled continue  Lopressor. Eliquis held in view of GI bleed and hematuria  Hypertension.    Blood pressure stable continue Lopressor and lisinopril.  CKD stage III.  Stable.  Creatinine at 1.34-1.24-1.36-1.35-1.16 today, at baseline  Generalized weakness physical therapy assessment- Son is requesting skilled nursing facility as they cannot take care of him at home with home health.  Patient is agreeable Follow-up with case management and social worker    All the records are reviewed and case discussed with Care Management/Social Workerr. Management plans discussed with the patient, son [name] at bedside and they are in agreement.  CODE STATUS: DNR  TOTAL TIME TAKING CARE OF THIS PATIENT: 35 minutes.   POSSIBLE D/C IN 1-2 DAYS, DEPENDING ON CLINICAL CONDITION.  Note: This dictation was prepared with Dragon dictation along with smaller phrase technology. Any transcriptional errors that result from this process are unintentional.   Ramonita LabAruna Brinda Focht M.D on 02/02/2018 at 6:36 PM  Between 7am to 6pm - Pager - [phone] After 6pm go to [email or web address] - password [email or web address] [facility]  [facility]  Office  [phone]  CC: Primary care physician; Alwyn Peaallwood, Dwayne D, MD

## 2018-02-02 NOTE — NC FL2 (Signed)
[place] MEDICAID FL2 LEVEL OF CARE SCREENING TOOL     IDENTIFICATION  Patient Name: Austin Zamora Birthdate: [birth year] Sex: male Admission Date (Current Location): 01/29/2018  [place] and [place] Number:  Chiropodist and Address:  [facility], [address] [place], [place] [address]      Provider Number: [id number]  Attending Physician Name and Address:  Ramonita Lab, MD  Relative Name and Phone Number:  Shuayb, Schepers [phone]  [phone] and Sires,Carolyn Sister [phone]     Current Level of Care: Hospital Recommended Level of Care: Skilled Nursing Facility Prior Approval Number:    Date Approved/Denied:   PASRR Number: [id number]  Discharge Plan: SNF    Current Diagnoses: Patient Active Problem List   Diagnosis Date Noted  . Pressure injury of skin 01/30/2018  . Acute on chronic systolic CHF (congestive heart failure) (HCC) 01/29/2018  . Acute renal failure (ARF) (HCC)   . Acute kidney injury (HCC)   . Nonrheumatic aortic valve stenosis   . Influenza A 05/17/2017  . Protein-calorie malnutrition, severe 05/17/2017  . Paroxysmal A-fib (HCC) 04/06/2017  . SOBOE (shortness of breath on exertion) 04/06/2017  . Alcohol abuse 10/16/2014  . Aortic stenosis 09/06/2014    Orientation RESPIRATION BLADDER Height & Weight     Self, Time, Situation, Place  O2(1L) Incontinent Weight: 160 lb 15 oz (73 kg) Height:  6' (182.9 cm)  BEHAVIORAL SYMPTOMS/MOOD NEUROLOGICAL BOWEL NUTRITION STATUS      Continent Diet(2g Sodium diet)  AMBULATORY STATUS COMMUNICATION OF NEEDS Skin   Limited Assist Verbally PU Stage and Appropriate Care   PU Stage 2 Dressing: (Every 3 days)                   Personal Care Assistance Level of Assistance  Bathing, Feeding, Dressing Bathing Assistance: Limited assistance Feeding assistance: Limited assistance Dressing Assistance: Limited assistance     Functional Limitations Info  Sight,  Hearing, Speech Sight Info: Adequate Hearing Info: Adequate Speech Info: Adequate    SPECIAL CARE FACTORS FREQUENCY  PT (By licensed PT), OT (By licensed OT)     PT Frequency: 5x a week OT Frequency: 5x a week            Contractures Contractures Info: Not present    Additional Factors Info  Code Status, Allergies Code Status Info: DNR Allergies Info: NKA           Current Medications (02/02/2018):  This is the current hospital active medication list Current Facility-Administered Medications  Medication Dose Route Frequency Provider Last Rate Last Dose  . 0.9 %  sodium chloride infusion  250 mL Intravenous PRN Shaune Pollack, MD      . acetaminophen (TYLENOL) tablet 650 mg  650 mg Oral Q6H PRN Shaune Pollack, MD   650 mg at 01/30/18 1750   Or  . acetaminophen (TYLENOL) suppository 650 mg  650 mg Rectal Q6H PRN Shaune Pollack, MD      . albuterol (PROVENTIL) (2.5 MG/3ML) 0.083% nebulizer solution 2.5 mg  2.5 mg Nebulization Q2H PRN Shaune Pollack, MD      . apixaban Everlene Balls) tablet 5 mg  5 mg Oral BID Shaune Pollack, MD   5 mg at 02/01/18 2132  . bisacodyl (DULCOLAX) EC tablet 5 mg  5 mg Oral Daily PRN Shaune Pollack, MD      . cefTRIAXone (ROCEPHIN) 1 g in sodium chloride 0.9 % 100 mL IVPB  1 g Intravenous Q24H Ramonita Lab, MD  200 mL/hr at 02/02/18 1319 1 g at 02/02/18 1319  . feeding supplement (ENSURE ENLIVE) (ENSURE ENLIVE) liquid 237 mL  237 mL Oral TID BM Gouru, Aruna, MD   237 mL at 02/02/18 0943  . furosemide (LASIX) injection 40 mg  40 mg Intravenous BID Shaune Pollackhen, Qing, MD   40 mg at 02/02/18 0932  . HYDROcodone-acetaminophen (NORCO/VICODIN) 5-325 MG per tablet 1-2 tablet  1-2 tablet Oral Q4H PRN Shaune Pollackhen, Qing, MD   1 tablet at 01/31/18 1719  . Influenza vac split quadrivalent PF (FLUZONE HIGH-DOSE) injection 0.5 mL  0.5 mL Intramuscular Tomorrow-1000 Shaune Pollackhen, Qing, MD      . lisinopril (PRINIVIL,ZESTRIL) tablet 20 mg  20 mg Oral Daily Shaune Pollackhen, Qing, MD   20 mg at 02/02/18 0931  . metoprolol succinate  (TOPROL-XL) 24 hr tablet 50 mg  50 mg Oral Daily Shaune Pollackhen, Qing, MD   50 mg at 02/02/18 0932  . multivitamin-lutein (OCUVITE-LUTEIN) capsule 1 capsule  1 capsule Oral Daily Gouru, Aruna, MD   1 capsule at 02/02/18 0933  . ondansetron (ZOFRAN) tablet 4 mg  4 mg Oral Q6H PRN Shaune Pollackhen, Qing, MD       Or  . ondansetron [facility](ZOFRAN) injection 4 mg  4 mg Intravenous Q6H PRN Shaune Pollackhen, Qing, MD      . senna-docusate (Senokot-S) tablet 1 tablet  1 tablet Oral QHS PRN Shaune Pollackhen, Qing, MD      . sodium chloride flush (NS) 0.9 % injection 3 mL  3 mL Intravenous Q12H Shaune Pollackhen, Qing, MD   3 mL at 02/02/18 0933  . sodium chloride flush (NS) 0.9 % injection 3 mL  3 mL Intravenous PRN Shaune Pollackhen, Qing, MD   3 mL at 01/31/18 1720     Discharge Medications: Please see discharge summary for a list of discharge medications.  Relevant Imaging Results:  Relevant Lab Results:   Additional Information SSN [id number]  Darleene Cleavernterhaus, Margaretha Mahan R, [place]

## 2018-02-02 NOTE — Plan of Care (Signed)

## 2018-02-02 NOTE — Plan of Care (Signed)
  Problem: Activity: Goal: Risk for activity intolerance will decrease Outcome: Progressing   Problem: Nutrition: Goal: Adequate nutrition will be maintained Outcome: Progressing   Problem: Education: Goal: Ability to verbalize understanding of medication therapies will improve Outcome: Progressing

## 2018-02-02 NOTE — Clinical Social Work Note (Signed)
CSW spoke to patient and his son, they would like to go to SNF for short term rehab.  CSW discussed process and what to expect at SNF, including how insurance will pay for SNF.  CSW was given permission [place], formal assessment to follow.  Ervin KnackEric R. Rodert Hinch, MSW, Theresia MajorsLCSWA [phone]  02/02/2018 2:35 PM

## 2018-02-03 LAB — BASIC METABOLIC PANEL
Anion gap: 4 — ABNORMAL LOW (ref 5–15)
Anion gap: 5 (ref 5–15)
BUN: 39 mg/dL — ABNORMAL HIGH (ref 8–23)
BUN: 37 mg/dL — ABNORMAL HIGH (ref 8–23)
CHLORIDE: 92 mmol/L — AB (ref 98–111)
CO2: 45 mmol/L — AB (ref 22–32)
CO2: 39 mmol/L — ABNORMAL HIGH (ref 22–32)
Calcium: 8.7 mg/dL — ABNORMAL LOW (ref 8.9–10.3)
Calcium: 8.6 mg/dL — ABNORMAL LOW (ref 8.9–10.3)
Chloride: 92 mmol/L — ABNORMAL LOW (ref 98–111)
Creatinine, Ser: 1.2 mg/dL (ref 0.61–1.24)
Creatinine, Ser: 0.99 mg/dL (ref 0.61–1.24)
GFR calc Af Amer: >60 mL/min (ref >60)
GFR calc Af Amer: >60 mL/min (ref >60)
GFR calc non Af Amer: 58 mL/min — ABNORMAL LOW (ref >60)
GFR calc non Af Amer: >60 mL/min (ref >60)
Glucose, Bld: 123 mg/dL — ABNORMAL HIGH (ref 70–99)
Glucose, Bld: 99 mg/dL (ref 70–99)
Potassium: 5.7 mmol/L — ABNORMAL HIGH (ref 3.5–5.1)
Potassium: 4.2 mmol/L (ref 3.5–5.1)
Sodium: 141 mmol/L (ref 135–145)
Sodium: 136 mmol/L (ref 135–145)

## 2018-02-03 LAB — CBC
HCT: 38.4 % — ABNORMAL LOW (ref 39.0–52.0)
HCT: 30 % — ABNORMAL LOW (ref 39.0–52.0)
Hemoglobin: 12 g/dL — ABNORMAL LOW (ref 13.0–17.0)
Hemoglobin: 9.4 g/dL — ABNORMAL LOW (ref 13.0–17.0)
MCH: 30.8 pg (ref 26.0–34.0)
MCH: 31 pg (ref 26.0–34.0)
MCHC: 31.3 g/dL (ref 30.0–36.0)
MCHC: 31.3 g/dL (ref 30.0–36.0)
MCV: 98.5 fL (ref 80.0–100.0)
MCV: 99 fL (ref 80.0–100.0)
PLATELETS: 147 10*3/uL — AB (ref 150–400)
Platelets: 143 10*3/uL — ABNORMAL LOW (ref 150–400)
RBC: 3.9 MIL/uL — ABNORMAL LOW (ref 4.22–5.81)
RBC: 3.03 MIL/uL — ABNORMAL LOW (ref 4.22–5.81)
RDW: 15.6 % — ABNORMAL HIGH (ref 11.5–15.5)
RDW: 15.8 % — ABNORMAL HIGH (ref 11.5–15.5)
WBC: 5 10*3/uL (ref 4.0–10.5)
WBC: 5.3 10*3/uL (ref 4.0–10.5)
nRBC: 0 % (ref 0.0–0.2)
nRBC: 0 % (ref 0.0–0.2)

## 2018-02-03 LAB — HEMOGLOBIN AND HEMATOCRIT, BLOOD
HCT: 35.3 % — ABNORMAL LOW (ref 39.0–52.0)
HCT: 33.4 % — ABNORMAL LOW (ref 39.0–52.0)
HCT: 28.5 % — ABNORMAL LOW (ref 39.0–52.0)
Hemoglobin: 11 g/dL — ABNORMAL LOW (ref 13.0–17.0)
Hemoglobin: 10.5 g/dL — ABNORMAL LOW (ref 13.0–17.0)
Hemoglobin: 9 g/dL — ABNORMAL LOW (ref 13.0–17.0)

## 2018-02-03 MED ORDER — FUROSEMIDE 40 MG PO TABS: 40.0000 mg | ORAL_TABLET | Freq: Every day | ORAL | Status: DC

## 2018-02-03 MED ORDER — PANTOPRAZOLE SODIUM 40 MG IV SOLR
40.0000 mg | Freq: Two times a day (BID) | INTRAVENOUS | Status: DC
  Administered 2018-02-03 – 2018-02-04 (×4): 40 mg via INTRAVENOUS
  Filled 2018-02-03 (×5): qty 40

## 2018-02-03 MED ORDER — SODIUM CHLORIDE 0.9% IV SOLUTION: Freq: Once | INTRAVENOUS | Status: DC

## 2018-02-03 MED ORDER — CEPHALEXIN 250 MG PO CAPS
250.0000 mg | ORAL_CAPSULE | Freq: Four times a day (QID) | ORAL | Status: DC
  Administered 2018-02-03 – 2018-02-06 (×12): 250 mg via ORAL
  Filled 2018-02-03 (×15): qty 1

## 2018-02-03 MED ORDER — PEG 3350-KCL-NA BICARB-NACL 420 G PO SOLR
4000.0000 mL | Freq: Once | ORAL | Status: AC
  Administered 2018-02-03: 4000 mL via ORAL
  Filled 2018-02-03: qty 4000

## 2018-02-03 MED ORDER — SODIUM CHLORIDE 0.9 % IV SOLN
250.0000 mL | INTRAVENOUS | Status: DC | PRN
  Administered 2018-02-04: 250 mL via INTRAVENOUS

## 2018-02-03 MED ORDER — CEFDINIR 300 MG PO CAPS
300.0000 mg | ORAL_CAPSULE | Freq: Two times a day (BID) | ORAL | Status: DC
  Filled 2018-02-03 (×2): qty 1

## 2018-02-03 MED ORDER — PATIROMER SORBITEX CALCIUM 8.4 G PO PACK
16.8000 g | PACK | Freq: Every day | ORAL | Status: DC
  Administered 2018-02-03: 16.8 g via ORAL
  Filled 2018-02-03 (×2): qty 2

## 2018-02-03 NOTE — Plan of Care (Signed)
  Problem: Elimination: Goal: Will not experience complications related to bowel motility Outcome: Progressing   Problem: Skin Integrity: Goal: Risk for impaired skin integrity will decrease Outcome: Progressing   Problem: Cardiac: Goal: Ability to achieve and maintain adequate cardiopulmonary perfusion will improve Outcome: Progressing

## 2018-02-03 NOTE — Consult Note (Signed)
GI Inpatient Consult Note  Reason for Consult:   Attending Requesting Consult:  History of Present Illness: Austin Zamora is a 77 y.o. male seen for evaluation of lower GI bleeding at the request of Dr. Amado Coe. Pt has a PMH of aortic stenosis, malnutrition, alcohol and tobacco abuse, paroxysmal atrial fibrillation on Eliquis, and CKD Stage III admitted to the hospital 11/29 for acute on chronic systolic heart failure with EF 20%. GI was consulted for apparent fresh blood in the stool. Hemoglobin has dropped from 15.0 last night to 12.0 this morning.   Pt seen and examined this afternoon resting comfortably in hospital bed. Son is present in room and helps with history. Son reports that for the past two days he has reported bright red to dark red blood in the stool. He reports an accident in the bed that was just fresh blood with no stool. His son reports the blood had a very distinct, strong odor. He also endorses some hematuria yesterday. Pt is on Eliquis for atrial fibrilation and this was held starting yesterday. Per nursing, pt had two bright red bloody BMs this morning. He denies any abdominal pain, diarrhea, constipation, or rectal pain. Pt is colonoscopy naive with no known family hx of colon cancer, adenomatous polyps, or IBD. He reportedly had stomach ulcer many years ago. He denies any upper GI symptoms of nausea, vomiting, dysphagia, odynophagia, early satiety, or epigastric abd pain.    Last Colonoscopy: Never performed Last Endoscopy: N/A   Past Medical History:  Past Medical History:  Diagnosis Date  . Acute kidney injury (HCC)   . Acute renal failure (ARF) (HCC)   . Alcohol abuse   . Aortic stenosis   . Nonrheumatic aortic valve stenosis   . Paroxysmal A-fib (HCC)   . Protein calorie malnutrition (HCC)   . SOBOE (shortness of breath on exertion)     Problem List: Patient Active Problem List   Diagnosis Date Noted  . Pressure injury of skin 01/30/2018  . Acute on chronic  systolic CHF (congestive heart failure) (HCC) 01/29/2018  . Acute renal failure (ARF) (HCC)   . Acute kidney injury (HCC)   . Nonrheumatic aortic valve stenosis   . Influenza A 05/17/2017  . Protein-calorie malnutrition, severe 05/17/2017  . Paroxysmal A-fib (HCC) 04/06/2017  . SOBOE (shortness of breath on exertion) 04/06/2017  . Alcohol abuse 10/16/2014  . Aortic stenosis 09/06/2014    Past Surgical History: Past Surgical History:  Procedure Laterality Date  . RIGHT/LEFT HEART CATH AND CORONARY ANGIOGRAPHY Bilateral 09/22/2017   Procedure: RIGHT/LEFT HEART CATH AND CORONARY ANGIOGRAPHY;  Surgeon: Lamar Blinks, MD;  Location: [facility] INVASIVE CV LAB;  Service: Cardiovascular;  Laterality: Bilateral;    Allergies: No Known Allergies  Home Medications: Medications Prior to Admission  Medication Sig Dispense Refill Last Dose  . ELIQUIS 5 MG TABS tablet Take 5 mg by mouth 2 (two) times daily.    09/12/2017  . furosemide (LASIX) 20 MG tablet Take 20 mg by mouth daily.    09/22/2017 at Unknown time  . lisinopril (PRINIVIL,ZESTRIL) 20 MG tablet Take 20 mg by mouth daily.    09/21/2017 at Unknown time  . metoprolol succinate (TOPROL-XL) 50 MG 24 hr tablet Take 50 mg by mouth daily.    09/21/2017 at Unknown time  . PROAIR HFA 108 (90 Base) MCG/ACT inhaler Inhale 2 puffs into the lungs every 4 (four) hours as needed for wheezing or shortness of breath.    Past Week at  Unknown time  . feeding supplement, ENSURE ENLIVE, (ENSURE ENLIVE) LIQD Take 237 mLs by mouth 3 (three) times daily between meals. 237 mL 12 Past Month at Unknown time  . nicotine (NICODERM CQ - DOSED IN MG/24 HOURS) 14 mg/24hr patch Place 1 patch (14 mg total) onto the skin daily. 28 patch 0 09/22/2017 at Unknown time   Home medication reconciliation was completed with the patient.   Scheduled Inpatient Medications:   . feeding supplement (ENSURE ENLIVE)  237 mL Oral TID BM  . [START ON 02/04/2018] furosemide  40 mg Oral Daily  .  Influenza vac split quadrivalent PF  0.5 mL Intramuscular Tomorrow-1000  . metoprolol succinate  50 mg Oral Daily  . multivitamin-lutein  1 capsule Oral Daily  . pantoprazole (PROTONIX) IV  40 mg Intravenous Q12H  . patiromer  16.8 g Oral Daily  . sodium chloride flush  3 mL Intravenous Q12H    Continuous Inpatient Infusions:   . sodium chloride    . cefTRIAXone (ROCEPHIN)  IV 1 g (02/02/18 1319)    PRN Inpatient Medications:  sodium chloride, acetaminophen **OR** acetaminophen, albuterol, bisacodyl, HYDROcodone-acetaminophen, ondansetron **OR** ondansetron (ZOFRAN) IV, senna-docusate, sodium chloride flush  Family History: family history includes Breast cancer in his mother; Parkinson's disease in his father.  The patient's family history is negative for inflammatory bowel disorders, GI malignancy, or solid organ transplantation.  Social History:   reports that he has quit smoking. His smoking use included cigarettes. He has a 30.00 pack-year smoking history. He has never used smokeless tobacco. He reports that he drinks about 2.0 standard drinks of alcohol per week. He reports that he does not use drugs. The patient denies ETOH, tobacco, or drug use.   Review of Systems: Constitutional: Weight is stable.  Eyes: No changes in vision. ENT: No oral lesions, sore throat.  GI: see HPI.  Heme/Lymph: No easy bruising.  CV: No chest pain.  GU: No hematuria.  Integumentary: No rashes.  Neuro: No headaches.  Psych: No depression/anxiety.  Endocrine: No heat/cold intolerance.  Allergic/Immunologic: No urticaria.  Resp: No cough, SOB.  Musculoskeletal: No joint swelling.    Physical Examination: BP 93/69 (BP Location: Left Arm)   Pulse (!) 58   Temp 97.9 F (36.6 C) (Oral)   Resp (!) 21   Ht 6' (1.829 m)   Wt 73 kg   SpO2 95%   BMI 21.83 kg/m  Gen: NAD, alert and oriented x 4 HEENT: PEERLA, EOMI, Neck: supple, no JVD or thyromegaly Chest: CTA bilaterally, no wheezes,  crackles, or other adventitious sounds CV: RRR, no m/g/c/r Abd: soft, NT, ND, +BS in all four quadrants; no HSM, guarding, ridigity, or rebound tenderness Ext: no edema, well perfused with 2+ pulses, Skin: no rash or lesions noted Lymph: no LAD  Data: Lab Results  Component Value Date   WBC 5.0 02/03/2018   HGB 11.0 (L) 02/03/2018   HCT 35.3 (L) 02/03/2018   MCV 98.5 02/03/2018   PLT 147 (L) 02/03/2018   Recent Labs  Lab 02/02/18 2154 02/03/18 0413 02/03/18 1006  HGB 14.0 12.0* 11.0*   Lab Results  Component Value Date   NA 141 02/03/2018   K 5.7 (H) 02/03/2018   CL 92 (L) 02/03/2018   CO2 45 (H) 02/03/2018   BUN 39 (H) 02/03/2018   CREATININE 1.20 02/03/2018   Lab Results  Component Value Date   ALT 29 05/17/2017   AST 47 (H) 05/17/2017   ALKPHOS 98 05/17/2017  BILITOT 1.8 (H) 05/17/2017   No results for input(s): APTT, INR, PTT in the last 168 hours. Assessment/Plan: Mr. Melvyn NethLewis is a 77 y.o. male with a PMH of aortic stenosis, malnutrition, alcohol and tobacco abuse, paroxysmal atrial fibrillation on Eliquis, and CKD Stage III admitted to the hospital 11/29 for acute on chronic systolic heart failure with EF 20%. GI consulted for  rectal bleeding.  1. Rectal bleeding 2. Acute blood loss anemia 3. Chronic anticoagulation - Differential consists of anal outlet etiology such as internal hemorrhoids, diverticular, polyps, IBD, AVMs, or malignancy. Less likely upper GI bleed but must consider due to acute blood loss anemia. Prior hx of duodenal ulcer. - Pt continues to have rectal bleeding. He is hemodynamically stable currently. Hg 12.0 today from 15.0 yesterday - Continue to monitor H&H. Transfuse for hemoglobin <7.0 - Due to the fact pt is colonoscopy naive and having rectal bleeding, I do recommend endoluminal evaluation with EGD/colonoscopy to rule out diverticular, AVMs, colon polyps, or malignancy. Discussed procedure details and indications with pt and potential  risks and complications, including bleeding, infection, small puncture to internal organs, or problems with anesthesia. He consents to proceed - Clear liquids today. Trilyte bowel prep starting 1700 today. NPO after midnight. - Plan for EGD/Colonoscopy tomorrow with Dr. Norma Fredricksonoledo - Further recs after procedures tomorrow   Thank you for the consult. Please call with questions or concerns.  Gilda CreaseGranville M Croley, PA-C [facility]  [phone]

## 2018-02-03 NOTE — Care Management (Signed)
Barrier to discharge- Bleeding from rectum and urine.  Hemoglobin dropped from 16 to 12.  GI consult pending. Chronic Eliquis is on hold.  For skilled nursing placement

## 2018-02-03 NOTE — Progress Notes (Signed)
Talked to Dr. Cherlynn KaiserSainani about patient's BP of 88/55, patient is asymptomatic. Patient's BP has been soft throughout the day, will have plan EGD and colonoscopy tomorrow for GI bleed. No other concern at the moment. RN will continue to monitor.

## 2018-02-03 NOTE — Progress Notes (Signed)
PT Cancellation Note  Patient Details Name: Austin Zamora MRN: [id number] Austin IvoryDOB: 01/15/1941   Cancelled Treatment:    Reason Eval/Treat Not Completed: Medical issues which prohibited therapy.  Pt's potassium elevated to 5.7 today.  Per PT guidelines for elevated potassium, will hold PT at this time and re-attempt PT treatment at a later date/time as medically appropriate.  Hendricks LimesEmily Qadir Folks, PT 02/03/18, 3:00 PM [phone]

## 2018-02-03 NOTE — Progress Notes (Signed)
[facility] [place] [facility]   PATIENT NAME: Austin FoldsHarry Zamora    MR#:  [id number]  DATE OF BIRTH:  09/18/1940  SUBJECTIVE:  CHIEF COMPLAINT: Still having lower GI bleed stool with fresh blood, denies any abdominal pain, nausea or vomiting.  Hematuria improved.  Patient with history of urinalysis in the past.  Feeling weak  REVIEW OF SYSTEMS:  CONSTITUTIONAL: No fever, fatigue or weakness.  EYES: No blurred or double vision.  EARS, NOSE, AND THROAT: No tinnitus or ear pain.  RESPIRATORY: No cough, reports improving shortness of breath, wheezing or hemoptysis.  CARDIOVASCULAR: No chest pain, orthopnea, edema.  GASTROINTESTINAL: No nausea, vomiting, diarrhea or abdominal pain.  MUSCULOSKELETAL: No joint pain or arthritis.   NEUROLOGIC: No tingling, numbness, weakness.  PSYCHIATRY: No anxiety or depression.   DRUG ALLERGIES:  No Known Allergies  VITALS:  Blood pressure (!) 88/55, pulse 85, temperature 98.4 F (36.9 C), temperature source Oral, resp. rate 18, height 6' (1.829 m), weight 73 kg, SpO2 99 %.  PHYSICAL EXAMINATION:  GENERAL:  77 y.o.-year-old patient lying in the bed with no acute distress.  EYES: Pupils equal, round, reactive to light and accommodation. No scleral icterus. Extraocular muscles intact.  HEENT: Head atraumatic, normocephalic. Oropharynx and nasopharynx clear.  NECK:  Supple, no jugular venous distention. No thyroid enlargement, no tenderness.  LUNGS: Moderate breath sounds bilaterally, no wheezing, minimal rales,rhonchi, no crepitation. No use of accessory muscles of respiration.  CARDIOVASCULAR: S1, S2 normal. No murmurs, rubs, or gallops.  ABDOMEN: Soft, nontender, nondistended. Bowel sounds present. Marland Kitchen.  EXTREMITIES: 2+ pedal edema, feet are erythematous very feeble pulses, no cyanosis, or clubbing.  NEUROLOGIC: Awake, alert and oriented x2sensation intact. Gait not checked.  PSYCHIATRIC: The patient is  awake and alert oriented  x2 today SKIN: No obvious rash, lesion, or ulcer.    LABORATORY PANEL:   CBC Recent Labs  Lab 02/03/18 0413  02/03/18 2107  WBC 5.0  --   --   HGB 12.0*   < > 9.0*  HCT 38.4*   < > 28.5*  PLT 147*  --   --    < > = values in this interval not displayed.   ------------------------------------------------------------------------------------------------------------------  Chemistries  Recent Labs  Lab 01/30/18 0408  02/03/18 0413  NA 143   < > 141  K 4.7   < > 5.7*  CL 103   < > 92*  CO2 32   < > 45*  GLUCOSE 109*   < > 123*  BUN 28*   < > 39*  CREATININE 1.24   < > 1.20  CALCIUM 9.0   < > 8.7*  MG 1.9  --   --    < > = values in this interval not displayed.   ------------------------------------------------------------------------------------------------------------------  Cardiac Enzymes Recent Labs  Lab 01/30/18 2158  TROPONINI 0.15*   ------------------------------------------------------------------------------------------------------------------  RADIOLOGY:  No results found.  EKG:   Orders placed or performed during the hospital encounter of 01/29/18  . ED EKG within 10 minutes  . ED EKG within 10 minutes  . EKG 12-Lead  . EKG 12-Lead    ASSESSMENT AND PLAN:   GI bleed and hematuria Discontinued Eliquis for now Monitor hemoglobin hematocrit closely Patient is hypotensive at this time with hemoglobin of 15-14.0-12.0-11.0-10.5-9.0 Will transfuse if patient's hemoglobin is below 8 Seen by GI Dr. Norma Fredricksonoledo scheduled patient for colonoscopy in a.m. n.p.o. after midnight Clear liquids and hydrate with IV fluids  PPI Hematuria improving  if recurs consider urology consult.  Check urinalysis   Acute respiratory failure with hypoxia due to acute on chronic systolic CHF with ejection fraction 20%. Clinically better CHF protocol, monitor daily weights, intake and output  Lasix discontinued as patient is significantly hypertensive Negative output - 8.1 L  from admission Hold lisinopril and Lopressor secondary to hypertension On 3 L of oxygen via nasal cannula will wean off as tolerated [facility]  cardiology consult.  Discussed with Dr. call wood Keep the legs elevated, teds  Bilateral lower extremity cellulitis with feeble pulses-probably from a component of peripheral arterial disease IV Rocephin changed to p.o. Keflex Teds Outpatient follow-up with vascular for ABIs and further investigation as recommended by vascular surgery  Elevated troponin, possible due to demanding ischemia secondary to above. Troponin 0 0.25 ---0.21-0.15 Eliquis held  Paroxysmal A. fib.    Rate controlled continue  Lopressor. Eliquis held in view of GI bleed and hematuria  Hypertension.    Blood pressure stable continue Lopressor and lisinopril.  CKD stage III.  Stable.  Creatinine at 1.34-1.24-1.36-1.35-1.16 today, at baseline  Generalized weakness physical therapy assessment- Son is requesting skilled nursing facility as they cannot take care of him at home with home health.  Patient is agreeable Follow-up with case management and social worker    All the records are reviewed and case discussed with Care Management/Social Workerr. Management plans discussed with the patient, son Austin Zamora at bedside and they are in agreement.  CODE STATUS: DNR  TOTAL TIME TAKING CARE OF THIS PATIENT: 35 minutes.   POSSIBLE D/C IN 1-2 DAYS, DEPENDING ON CLINICAL CONDITION.  Note: This dictation was prepared with Dragon dictation along with smaller phrase technology. Any transcriptional errors that result from this process are unintentional.   Ramonita Lab M.D on 02/03/2018 at 9:39 PM  Between 7am to 6pm - Pager - [phone] After 6pm go to [email or web address] - password [email or web address] [facility]  [facility]  Office  [phone]  CC: Primary care physician; Alwyn Pea, MD

## 2018-02-04 ENCOUNTER — Inpatient Hospital Stay: Payer: Medicare Other | Admitting: Anesthesiology

## 2018-02-04 ENCOUNTER — Encounter
Admission: EM | Disposition: A | Discharge status: Skilled Nursing Facility | Payer: Self-pay | Source: Home / Self Care | Attending: Internal Medicine

## 2018-02-04 ENCOUNTER — Encounter: Payer: Self-pay | Admitting: Internal Medicine

## 2018-02-04 DIAGNOSIS — K922 Gastrointestinal hemorrhage, unspecified: Secondary | ICD-10-CM

## 2018-02-04 HISTORY — PX: ESOPHAGOGASTRODUODENOSCOPY: SHX5428

## 2018-02-04 HISTORY — PX: COLONOSCOPY: SHX5424

## 2018-02-04 HISTORY — DX: Gastrointestinal hemorrhage, unspecified: K92.2

## 2018-02-04 LAB — URINALYSIS, COMPLETE (UACMP) WITH MICROSCOPIC
Bilirubin Urine: NEGATIVE
Glucose, UA: NEGATIVE mg/dL
Ketones, ur: NEGATIVE mg/dL
Leukocytes, UA: NEGATIVE
Nitrite: NEGATIVE
Protein, ur: NEGATIVE mg/dL
RBC / HPF: >50 RBC/hpf — ABNORMAL HIGH (ref 0–5)
Specific Gravity, Urine: 1.013 (ref 1.005–1.030)
pH: 8 (ref 5.0–8.0)

## 2018-02-04 LAB — HEMOGLOBIN AND HEMATOCRIT, BLOOD
HCT: 31.8 % — ABNORMAL LOW (ref 39.0–52.0)
Hemoglobin: 9.9 g/dL — ABNORMAL LOW (ref 13.0–17.0)

## 2018-02-04 SURGERY — COLONOSCOPY
Anesthesia: General

## 2018-02-04 MED ORDER — LIDOCAINE HCL (PF) 2 % IJ SOLN
INTRAMUSCULAR | Status: AC
  Filled 2018-02-04: qty 10

## 2018-02-04 MED ORDER — PROPOFOL 500 MG/50ML IV EMUL
INTRAVENOUS | Status: AC
  Filled 2018-02-04: qty 50

## 2018-02-04 MED ORDER — PROPOFOL 500 MG/50ML IV EMUL
INTRAVENOUS | Status: DC | PRN
  Administered 2018-02-04: 70 ug/kg/min via INTRAVENOUS
  Administered 2018-02-04: 60 ug/kg/min via INTRAVENOUS

## 2018-02-04 MED ORDER — KETAMINE HCL 50 MG/ML IJ SOLN
INTRAMUSCULAR | Status: AC
  Filled 2018-02-04: qty 10

## 2018-02-04 MED ORDER — FUROSEMIDE 10 MG/ML IJ SOLN
40.0000 mg | Freq: Every day | INTRAMUSCULAR | Status: DC
  Filled 2018-02-04: qty 4

## 2018-02-04 MED ORDER — KETAMINE HCL 10 MG/ML IJ SOLN
INTRAMUSCULAR | Status: DC | PRN
  Administered 2018-02-04: 10 mg via INTRAVENOUS

## 2018-02-04 MED ORDER — PROPOFOL 10 MG/ML IV BOLUS
INTRAVENOUS | Status: DC | PRN
  Administered 2018-02-04: 45 mg via INTRAVENOUS

## 2018-02-04 MED ORDER — PHENYLEPHRINE HCL 10 MG/ML IJ SOLN
INTRAMUSCULAR | Status: DC | PRN
  Administered 2018-02-04 (×2): 200 ug via INTRAVENOUS
  Administered 2018-02-04: 100 ug via INTRAVENOUS
  Administered 2018-02-04: 200 ug via INTRAVENOUS

## 2018-02-04 MED ORDER — METOPROLOL TARTRATE 25 MG PO TABS
25.0000 mg | ORAL_TABLET | Freq: Two times a day (BID) | ORAL | Status: DC
  Administered 2018-02-04 – 2018-02-06 (×4): 25 mg via ORAL
  Filled 2018-02-04 (×4): qty 1

## 2018-02-04 NOTE — Anesthesia Post-op Follow-up Note (Signed)
Anesthesia QCDR form completed.        

## 2018-02-04 NOTE — Progress Notes (Signed)
Unable to reach cecum after repositioning and pressure

## 2018-02-04 NOTE — Anesthesia Preprocedure Evaluation (Addendum)
Anesthesia Evaluation  Patient identified by MRN, date of birth, ID band Patient awake    Reviewed: Allergy & Precautions, H&P , NPO status , reviewed documented beta blocker date and time   Airway Mallampati: II  TM Distance: >3 FB Neck ROM: limited    Dental  (+) Edentulous Upper, Poor Dentition, Missing   Pulmonary former smoker,     + decreased breath sounds      Cardiovascular +CHF   Rhythm:irregular + Systolic murmurs 1/61093/2019 ECHO Study Conclusions  - Left ventricle: The cavity size was moderately dilated. Systolic   function was severely reduced. The estimated ejection fraction   was 20%. Diffuse hypokinesis. The study is not technically   sufficient to allow evaluation of LV diastolic function. - Aortic valve: There was mild regurgitation. Valve area (VTI):   0.36 cm^2. Valve area (Vmax): 0.41 cm^2. Valve area (Vmean): 0.35   cm^2. - Mitral valve: Calcified annulus. There was moderate   regurgitation. - Left atrium: The atrium was mildly dilated. - Right ventricle: The cavity size was mildly dilated. - Tricuspid valve: There was moderate regurgitation. - Pericardium, extracardiac: A trivial pericardial effusion was   identified. There was a left pleural effusion.  Impressions:  - 4 chamber dilatation with severe LV systolic dysfunction and   diffuse hypokinesis and severe Aortic stenosis and mild AR.   Moderate MR. Large left pleural effusion   Neuro/Psych PSYCHIATRIC DISORDERS    GI/Hepatic   Endo/Other    Renal/GU Renal disease     Musculoskeletal   Abdominal   Peds  Hematology   Anesthesia Other Findings Past Medical History: No date: Acute kidney injury (HCC) No date: Acute renal failure (ARF) (HCC) No date: Alcohol abuse No date: Aortic stenosis No date: Nonrheumatic aortic valve stenosis No date: Paroxysmal A-fib (HCC) No date: Protein calorie malnutrition (HCC) No date: SOBOE  (shortness of breath on exertion)  Past Surgical History: 09/22/2017: RIGHT/LEFT HEART CATH AND CORONARY ANGIOGRAPHY; Bilateral     Comment:  Procedure: RIGHT/LEFT HEART CATH AND CORONARY               ANGIOGRAPHY;  Surgeon: Lamar BlinksKowalski, Bruce J, MD;  Location:               [facility] INVASIVE CV LAB;  Service: Cardiovascular;                Laterality: Bilateral;  BMI    Body Mass Index:  21.83 kg/m      Reproductive/Obstetrics                            Anesthesia Physical Anesthesia Plan  ASA: IV  Anesthesia Plan: General   Post-op Pain Management:    Induction: Intravenous  PONV Risk Score and Plan: 2 and Treatment may vary due to age or medical condition and TIVA  Airway Management Planned: Nasal Cannula and Natural Airway  Additional Equipment:   Intra-op Plan:   Post-operative Plan:   Informed Consent: I have reviewed the patients History and Physical, chart, labs and discussed the procedure including the risks, benefits and alternatives for the proposed anesthesia with the patient or authorized representative who has indicated his/her understanding and acceptance.   Dental Advisory Given  Plan Discussed with: CRNA  Anesthesia Plan Comments:         Anesthesia Quick Evaluation

## 2018-02-04 NOTE — Transfer of Care (Signed)
Immediate Anesthesia Transfer of Care Note  Patient: Austin Zamora  Procedure(s) Performed: Procedure(s): COLONOSCOPY (N/A) ESOPHAGOGASTRODUODENOSCOPY (EGD) (N/A)  Patient Location: PACU and Endoscopy Unit  Anesthesia Type:General  Level of Consciousness: sedated  Airway & Oxygen Therapy: Patient Spontanous Breathing and Patient connected to nasal cannula oxygen  Post-op Assessment: Report given to RN and Post -op Vital signs reviewed and stable  Post vital signs: Reviewed and stable  Last Vitals:  Vitals:   02/04/18 0812 02/04/18 1254  BP: 106/73 (!) 82/59  Pulse: 81 85  Resp: 19 17  Temp: 36.5 C (!) 36.2 C  SpO2: 100% 100%    Complications: No apparent anesthesia complications

## 2018-02-04 NOTE — Anesthesia Procedure Notes (Signed)
Date/Time: 02/04/2018 11:59 AM Performed by: Stormy Fabianurtis, Benard Minturn, CRNA Pre-anesthesia Checklist: Patient identified, Emergency Drugs available, Suction available and Patient being monitored Patient Re-evaluated:Patient Re-evaluated prior to induction Oxygen Delivery Method: Nasal cannula Induction Type: IV induction Dental Injury: Teeth and Oropharynx as per pre-operative assessment  Comments: Nasal cannula with etCO2 monitoring

## 2018-02-04 NOTE — Care Management Important Message (Signed)
Copy of signed IM left in patient's room (out for procedure). 

## 2018-02-04 NOTE — Progress Notes (Signed)
Austin Zamora is a 77 y.o. male patient. 1. Other fatigue   2. Peripheral edema   3. Elevated troponin   4. Pleural effusion    Past Medical History:  Diagnosis Date  . Acute kidney injury (HCC)   . Acute renal failure (ARF) (HCC)   . Alcohol abuse   . Aortic stenosis   . Nonrheumatic aortic valve stenosis   . Paroxysmal A-fib (HCC)   . Protein calorie malnutrition (HCC)   . SOBOE (shortness of breath on exertion)    Current Facility-Administered Medications  Medication Dose Route Frequency Provider Last Rate Last Dose  . [MAR Hold] 0.9 %  sodium chloride infusion (Manually program via Guardrails IV Fluids)   Intravenous Once Gouru, Aruna, MD      . Mitzi Hansen[MAR Hold] 0.9 %  sodium chloride infusion  250 mL Intravenous PRN Gouru, Aruna, MD 75 mL/hr at 02/04/18 0030 250 mL at 02/04/18 0030  . [MAR Hold] acetaminophen (TYLENOL) tablet 650 mg  650 mg Oral Q6H PRN Shaune Pollackhen, Qing, MD   650 mg at 01/30/18 1750   Or  . [MAR Hold] acetaminophen (TYLENOL) suppository 650 mg  650 mg Rectal Q6H PRN Shaune Pollackhen, Qing, MD      . Mitzi Hansen[MAR Hold] albuterol (PROVENTIL) (2.5 MG/3ML) 0.083% nebulizer solution 2.5 mg  2.5 mg Nebulization Q2H PRN Shaune Pollackhen, Qing, MD      . Mitzi Hansen[MAR Hold] bisacodyl (DULCOLAX) EC tablet 5 mg  5 mg Oral Daily PRN Shaune Pollackhen, Qing, MD      . Mitzi Hansen[MAR Hold] cephALEXin [facility](KEFLEX) capsule 250 mg  250 mg Oral Q6H Gouru, Aruna, MD   250 mg at 02/04/18 0733  . [MAR Hold] feeding supplement (ENSURE ENLIVE) (ENSURE ENLIVE) liquid 237 mL  237 mL Oral TID BM Gouru, Aruna, MD   237 mL at 02/02/18 2313  . [MAR Hold] HYDROcodone-acetaminophen (NORCO/VICODIN) 5-325 MG per tablet 1-2 tablet  1-2 tablet Oral Q4H PRN Shaune Pollackhen, Qing, MD   1 tablet at 01/31/18 1719  . [MAR Hold] Influenza vac split quadrivalent PF (FLUZONE HIGH-DOSE) injection 0.5 mL  0.5 mL Intramuscular Tomorrow-1000 Shaune Pollackhen, Qing, MD      . Mitzi Hansen[MAR Hold] multivitamin-lutein (OCUVITE-LUTEIN) capsule 1 capsule  1 capsule Oral Daily Gouru, Aruna, MD   1 capsule at 02/02/18 0933  .  [MAR Hold] ondansetron (ZOFRAN) tablet 4 mg  4 mg Oral Q6H PRN Shaune Pollackhen, Qing, MD       Or  . Mitzi Hansen[MAR Hold] ondansetron [facility](ZOFRAN) injection 4 mg  4 mg Intravenous Q6H PRN Shaune Pollackhen, Qing, MD      . Mitzi Hansen[MAR Hold] pantoprazole (PROTONIX) injection 40 mg  40 mg Intravenous Q12H Gouru, Deanna ArtisAruna, MD   40 mg at 02/04/18 0902  . [MAR Hold] senna-docusate (Senokot-S) tablet 1 tablet  1 tablet Oral QHS PRN Shaune Pollackhen, Qing, MD      . Mitzi Hansen[MAR Hold] sodium chloride flush (NS) 0.9 % injection 3 mL  3 mL Intravenous Q12H Shaune Pollackhen, Qing, MD   3 mL at 02/04/18 0905  . [MAR Hold] sodium chloride flush (NS) 0.9 % injection 3 mL  3 mL Intravenous PRN Shaune Pollackhen, Qing, MD   3 mL at 01/31/18 1720   No Known Allergies Active Problems:   Acute on chronic systolic CHF (congestive heart failure) (HCC)   Pressure injury of skin  Blood pressure 106/73, pulse 81, temperature 97.7 F (36.5 C), temperature source Oral, resp. rate 19, height 6' (1.829 m), weight 73 kg, SpO2 100 %.  Subjective Objective Assessment & Plan  Pt not  available. Out of room for procedure. Re attempt at a later time/date.  Scot Dock, PTA 02/04/2018

## 2018-02-04 NOTE — Progress Notes (Signed)
[employer] [facility] [place] [facility]   PATIENT NAME: Austin FoldsHarry Zamora    MR#:  [id number]  DATE OF BIRTH:  09/30/1940  SUBJECTIVE:   patient still having bloody stools  REVIEW OF SYSTEMS:    Review of Systems  Constitutional: Negative for fever, chills weight loss HENT: Negative for ear pain, nosebleeds, congestion, facial swelling, rhinorrhea, neck pain, neck stiffness and ear discharge.   Respiratory: Negative for cough, shortness of breath, wheezing  Cardiovascular: Negative for chest pain, palpitations and leg swelling.  Gastrointestinal: Negative for heartburn, abdominal pain, vomiting, diarrhea or consitpation  ++BLOODY STOOLS  Genitourinary: Negative for dysuria, urgency, frequency, hematuria Musculoskeletal: Negative for back pain or joint pain Neurological: Negative for dizziness, seizures, syncope, focal weakness,  numbness and headaches.  Hematological: Does not bruise/bleed easily.  Psychiatric/Behavioral: Negative for hallucinations, confusion, dysphoric mood    Tolerating Diet: npo      DRUG ALLERGIES:  No Known Allergies  VITALS:  Blood pressure 106/73, pulse 81, temperature 97.7 F (36.5 C), temperature source Oral, resp. rate 19, height 6' (1.829 m), weight 73 kg, SpO2 100 %.  PHYSICAL EXAMINATION:  Constitutional: Appears well-developed and well-nourished. No distress. HENT: Normocephalic. Marland Kitchen. Oropharynx is clear and moist.  Eyes: Conjunctivae and EOM are normal. PERRLA, no scleral icterus.  Neck: Normal ROM. Neck supple. No JVD. No tracheal deviation. CVS: RRR, S1/S2 +, no murmurs, no gallops, no carotid bruit.  Pulmonary: Effort and breath sounds normal, no stridor, rhonchi, wheezes, rales.  Abdominal: Soft. BS +,  no distension, tenderness, rebound or guarding.  Musculoskeletal: Normal range of motion. No edema and no tenderness.  Neuro: Alert. CN 2-12 grossly intact. No focal deficits. Skin: Skin is warm and dry. No rash  noted. Psychiatric: Normal mood and affect.      LABORATORY PANEL:   CBC Recent Labs  Lab 02/03/18 2201 02/04/18 0941  WBC 5.3  --   HGB 9.4* 9.9*  HCT 30.0* 31.8*  PLT 143*  --    ------------------------------------------------------------------------------------------------------------------  Chemistries  Recent Labs  Lab 01/30/18 0408  02/03/18 2201  NA 143   < > 136  K 4.7   < > 4.2  CL 103   < > 92*  CO2 32   < > 39*  GLUCOSE 109*   < > 99  BUN 28*   < > 37*  CREATININE 1.24   < > 0.99  CALCIUM 9.0   < > 8.6*  MG 1.9  --   --    < > = values in this interval not displayed.   ------------------------------------------------------------------------------------------------------------------  Cardiac Enzymes Recent Labs  Lab 01/29/18 1528 01/30/18 1618 01/30/18 2158  TROPONINI 0.25* 0.21* 0.15*   ------------------------------------------------------------------------------------------------------------------  RADIOLOGY:  No results found.   ASSESSMENT AND PLAN:   77 year old male with history of chronic kidney disease stage III, alcohol abuse, PAF, chronic systolic heart failure presented to the emergency room due to shortness of breath.  1.  Acute hypoxic respiratory failure due to acute on chronic systolic heart failure ejection fraction of 20%: Patient is diuresed with IV Lasix. Wean oxygen as tolerated Continue 40 mg IV Lasix daily with monitoring of intake and output with daily weight  2.  Acute blood loss anemia with rectal bleeding: Patient is planned for EGD and colonoscopy   3.  Lower extremity cellulitis: Continue Keflex He will need outpatient follow-up with vascular surgery for ABIs  4.  Elevated troponin due to demand ischemia Patient ruled out for ACS  5.  PAF: Due to GI bleed Eliquis on hold Continue rate control with Metoprolol  6.  Chronic kidney disease: Creatinine is stable and at baseline   His goal therapy  consultation pending Management plans discussed with the patient and  he is in agreement.  CODE STATUS: dnr  TOTAL TIME TAKING CARE OF THIS PATIENT: 25 minutes.     POSSIBLE D/C 1-2 days, DEPENDING ON CLINICAL CONDITION.   Lummie Montijo M.D on 02/04/2018 at 12:28 PM  Between 7am to 6pm - Pager - [phone] After 6pm go to [email or web address] - password [employer]  [facility]  Office  [phone]  CC: Primary care physician; Alwyn Pea, MD  Note: This dictation was prepared with Dragon dictation along with smaller phrase technology. Any transcriptional errors that result from this process are unintentional.

## 2018-02-04 NOTE — Plan of Care (Signed)
  Problem: Clinical Measurements: Goal: Ability to maintain clinical measurements within normal limits will improve Outcome: Progressing Note:  Pt has had approximately 3 bloody stools today, less than days prior, Hemogloin up from 9.0-9.9 today despite active bleeding.

## 2018-02-04 NOTE — Progress Notes (Signed)
[name] can D/C to [facility] when medically stable. Patient's son [name] is aware of above.   [employer] Inc[employer]Bailey Eual Lindstrom, LCSW [phone]

## 2018-02-04 NOTE — Op Note (Signed)
[facility] Gastroenterology Patient Name: Austin FoldsHarry Derasmo Procedure Date: 02/04/2018 11:55 AM MRN: [id number] Account #: [id number] Date of Birth: 07/26/1940 Admit Type: Inpatient Age: 77 [facility]: [facility] ENDO ROOM 4 Gender: Male Note Status: Finalized Procedure:            Upper GI endoscopy Indications:          Recent gastrointestinal bleeding, Hematochezia Providers:            Boykin Nearingeodoro K. Norma Fredricksonoledo MD, MD Referring MD:         Bobbie Stackwayne D. Callwood, MD (Referring MD) Medicines:            Propofol per Anesthesia Complications:        No immediate complications. Procedure:            Pre-Anesthesia Assessment:                       - The risks and benefits of the procedure and the                        sedation options and risks were discussed with the                        patient. All questions were answered and informed                        consent was obtained.                       - Patient identification and proposed procedure were                        verified prior to the procedure by the nurse. The                        procedure was verified in the procedure room.                       - ASA Grade Assessment: III - A patient with severe                        systemic disease.                       - After reviewing the risks and benefits, the patient                        was deemed in satisfactory condition to undergo the                        procedure.                       After obtaining informed consent, the endoscope was                        passed under direct vision. Throughout the procedure,                        the patient's blood pressure, pulse, and oxygen  saturations were monitored continuously. The Endoscope                        was introduced through the mouth, and advanced to the                        third part of duodenum. The upper GI endoscopy was                        accomplished without difficulty.  The patient tolerated                        the procedure well. Findings:      The examined esophagus was normal.      A 1 cm hiatal hernia was present.      Patchy mild inflammation characterized by congestion (edema) and       erythema was found in the entire examined stomach.      The examined duodenum was normal. Biopsies were taken with a cold       forceps for histology.      The exam was otherwise without abnormality. Impression:           - Normal esophagus.                       - 1 cm hiatal hernia.                       - Gastritis.                       - Normal examined duodenum.                       - The examination was otherwise normal.                       - No specimens collected. Recommendation:       - Proceed with colonoscopy Procedure Code(s):    --- Professional ---                       [phone], Esophagogastroduodenoscopy, flexible, transoral;                        diagnostic, including collection of specimen(s) by                        brushing or washing, when performed (separate procedure) Diagnosis Code(s):    --- Professional ---                       K92.1, Melena (includes Hematochezia)                       K92.2, Gastrointestinal hemorrhage, unspecified                       K29.70, Gastritis, unspecified, without bleeding                       K44.9, Diaphragmatic hernia without obstruction or                        gangrene  CPT copyright 2018 American Medical Association. All rights reserved. The codes documented in this report are preliminary and upon coder review may  be revised to meet current compliance requirements. Stanton Kidney MD, MD 02/04/2018 12:25:09 PM This report has been signed electronically. Number of Addenda: 0 Note Initiated On: 02/04/2018 11:55 AM      [facility]

## 2018-02-04 NOTE — Op Note (Signed)
[facility] Gastroenterology Patient Name: Linda Biehn Procedure Date: 02/04/2018 11:54 AM MRN: [id number] Account #: [id number] Date of Birth: Jun 10, 1940 Admit Type: Inpatient Age: 77 Room: [facility] ENDO ROOM 4 Gender: Male Note Status: Finalized Procedure:            Colonoscopy Indications:          Hematochezia Providers:            Boykin Nearing. Norma Fredrickson MD, MD Referring MD:         Bobbie Stack. Callwood, MD (Referring MD) Medicines:            Propofol per Anesthesia Complications:        No immediate complications. Procedure:            Pre-Anesthesia Assessment:                       - The risks and benefits of the procedure and the                        sedation options and risks were discussed with the                        patient. All questions were answered and informed                        consent was obtained.                       - Patient identification and proposed procedure were                        verified prior to the procedure by the nurse. The                        procedure was verified in the procedure room.                       - ASA Grade Assessment: IV - A patient with severe                        systemic disease that is a constant threat to life.                       - After reviewing the risks and benefits, the patient                        was deemed in satisfactory condition to undergo the                        procedure.                       After obtaining informed consent, the colonoscope was                        passed under direct vision. Throughout the procedure,                        the patient's blood pressure, pulse, and oxygen  saturations were monitored continuously. The                        Colonoscope was introduced through the anus and                        advanced to the the ileocecal valve. The colonoscopy                        was unusually difficult due to inadequate bowel prep                        and significant looping. Successful completion of the                        procedure was aided by changing the patient to a prone                        position. The patient tolerated the procedure well. The                        quality of the bowel preparation was inadequate. Findings:      The perianal and digital rectal examinations were normal. Pertinent       negatives include normal sphincter tone and no palpable rectal lesions.      Many small and large-mouthed diverticula were found in the sigmoid colon.      Copious quantities of semi-solid stool was found in the entire colon,       interfering with visualization. Lavage of the area was performed using a       large amount of sterile water, resulting in incomplete clearance with       fair visualization.      Hematin (altered blood/coffee-ground-like material) was found in the       entire colon. No biopsies or other specimens were collected for this       exam. Impression:           - Preparation of the colon was inadequate.                       - Diverticulosis in the sigmoid colon.                       - Stool in the entire examined colon.                       - Blood in the entire examined colon. No specimens                        collected. Recommendation:       - Return patient to hospital ward for ongoing care.                       - Clear liquid diet. Procedure Code(s):    --- Professional ---                       [phone], Colonoscopy, flexible; diagnostic, including                        collection of specimen(s) by brushing or washing, when  performed (separate procedure) Diagnosis Code(s):    --- Professional ---                       K57.30, Diverticulosis of large intestine without                        perforation or abscess without bleeding                       K92.1, Melena (includes Hematochezia)                       K92.2, Gastrointestinal hemorrhage,  unspecified CPT copyright 2018 American Medical Association. All rights reserved. The codes documented in this report are preliminary and upon coder review may  be revised to meet current compliance requirements. Stanton Kidney MD, MD 02/04/2018 12:56:12 PM This report has been signed electronically. Number of Addenda: 0 Note Initiated On: 02/04/2018 11:54 AM Total Procedure Duration: 0 hours 16 minutes 29 seconds       [facility]

## 2018-02-04 NOTE — Progress Notes (Signed)
Notified Dr. Caryn BeeMaier about patient refusing to finished golytely prep. Educated patient that they will not be able to do colonoscopy tomorrow with him refusing to finished prep, verbalized understanding. RN will continue to monitor.

## 2018-02-05 LAB — BASIC METABOLIC PANEL
Anion gap: 5 (ref 5–15)
BUN: 32 mg/dL — AB (ref 8–23)
CO2: 36 mmol/L — ABNORMAL HIGH (ref 22–32)
Calcium: 8.6 mg/dL — ABNORMAL LOW (ref 8.9–10.3)
Chloride: 95 mmol/L — ABNORMAL LOW (ref 98–111)
Creatinine, Ser: 1.05 mg/dL (ref 0.61–1.24)
GFR calc Af Amer: >60 mL/min (ref >60)
Glucose, Bld: 110 mg/dL — ABNORMAL HIGH (ref 70–99)
Potassium: 4.1 mmol/L (ref 3.5–5.1)
Sodium: 136 mmol/L (ref 135–145)

## 2018-02-05 LAB — CBC
HCT: 27 % — ABNORMAL LOW (ref 39.0–52.0)
Hemoglobin: 8.6 g/dL — ABNORMAL LOW (ref 13.0–17.0)
MCH: 31.4 pg (ref 26.0–34.0)
MCHC: 31.9 g/dL (ref 30.0–36.0)
MCV: 98.5 fL (ref 80.0–100.0)
PLATELETS: 153 10*3/uL (ref 150–400)
RBC: 2.74 MIL/uL — ABNORMAL LOW (ref 4.22–5.81)
RDW: 15.9 % — ABNORMAL HIGH (ref 11.5–15.5)
WBC: 8.4 10*3/uL (ref 4.0–10.5)
nRBC: 0 % (ref 0.0–0.2)

## 2018-02-05 MED ORDER — FUROSEMIDE 40 MG PO TABS
40.0000 mg | ORAL_TABLET | Freq: Every day | ORAL | Status: DC
  Administered 2018-02-06: 40 mg via ORAL
  Filled 2018-02-05: qty 1

## 2018-02-05 MED ORDER — FAMOTIDINE 20 MG PO TABS
20.0000 mg | ORAL_TABLET | Freq: Two times a day (BID) | ORAL | Status: DC
  Administered 2018-02-05 – 2018-02-06 (×3): 20 mg via ORAL
  Filled 2018-02-05 (×3): qty 1

## 2018-02-05 MED ORDER — PANTOPRAZOLE SODIUM 40 MG PO TBEC
40.0000 mg | DELAYED_RELEASE_TABLET | Freq: Every day | ORAL | Status: DC
  Administered 2018-02-05 – 2018-02-06 (×2): 40 mg via ORAL
  Filled 2018-02-05 (×2): qty 1

## 2018-02-05 MED ORDER — INFLUENZA VAC SPLIT HIGH-DOSE 0.5 ML IM SUSY
0.5000 mL | PREFILLED_SYRINGE | Freq: Once | INTRAMUSCULAR | Status: AC
  Administered 2018-02-05: 0.5 mL via INTRAMUSCULAR
  Filled 2018-02-05: qty 0.5

## 2018-02-05 NOTE — Evaluation (Signed)
Clinical/Bedside Swallow Evaluation Patient Details  Name: Austin Zamora MRN: [id number] Date of Birth: 12/12/1940  Today's Date: 02/05/2018 Time: SLP Start Time (ACUTE ONLY): 1000 SLP Stop Time (ACUTE ONLY): 1100 SLP Time Calculation (min) (ACUTE ONLY): 60 min  Past Medical History:  Past Medical History:  Diagnosis Date  . Acute kidney injury (HCC)   . Acute renal failure (ARF) (HCC)   . Alcohol abuse   . Aortic stenosis   . Nonrheumatic aortic valve stenosis   . Paroxysmal A-fib (HCC)   . Protein calorie malnutrition (HCC)   . SOBOE (shortness of breath on exertion)    Past Surgical History:  Past Surgical History:  Procedure Laterality Date  . COLONOSCOPY N/A 02/04/2018   Procedure: COLONOSCOPY;  Surgeon: Toledo, Boykin Nearingeodoro K, MD;  Location: [facility] ENDOSCOPY;  Service: Gastroenterology;  Laterality: N/A;  . ESOPHAGOGASTRODUODENOSCOPY N/A 02/04/2018   Procedure: ESOPHAGOGASTRODUODENOSCOPY (EGD);  Surgeon: Toledo, Boykin Nearingeodoro K, MD;  Location: [facility] ENDOSCOPY;  Service: Gastroenterology;  Laterality: N/A;  . RIGHT/LEFT HEART CATH AND CORONARY ANGIOGRAPHY Bilateral 09/22/2017   Procedure: RIGHT/LEFT HEART CATH AND CORONARY ANGIOGRAPHY;  Surgeon: Lamar BlinksKowalski, Bruce J, MD;  Location: [facility] INVASIVE CV LAB;  Service: Cardiovascular;  Laterality: Bilateral;   HPI:  Pt is a 77 y.o. male with a known history of Protein calorie Malnutrition, CKD, alcohol abuse, aortic stenosis, paroxysmal A. fib and systolic CHF.  The patient presented to ED with above chief complaints.  The patient is not a good historian.  According to Dr. Effie Shyoleman, the son stated that the patient has had worsening shortness of breath and leg swelling for the past couple of weeks.  He has not been taking Lasix as prescribed.  He has cough, shortness of breath and leg swelling but denies any fever or chills.  He is found hypoxia and put on oxygen 2 L.  Chest x-ray show bilateral pleural effusion.  During this admission, GI followed pt for  bloody stools; other issues.  An EGD performed revealed: "Patchy mild inflammation characterized by congestion (edema) and erythema was found in the entire examined stomach"; pt is on a PPI.  He reports increased amount of phlegm and belching.  Pt was admitted w/ Acute respiratory failure with hypoxia due to acute on chronic systolic CHF with ejection fraction 20%.  Unsure of his baseline Cognitive status; mumbled speech and required cues for follow through/direction.    Assessment / Plan / Recommendation Clinical Impression  Pt seen for BSE this AM. Helped position pt more upright in bed for the BSE as he was slid down in the bed attempting to begin drinking his Ensure. Pt consumed po trials of thin liquids, purees, and softened solid foods w/ no overt s/s of aspiration noted; no decline in vocal quality or respiratory status noted during/post trials. Pt exhibited similar expectorating of phlegm and throat clear/cough as he did PRIOR to po's. This did not appear to increase in intensity or frequency as po trials continued. Oral phase [facility]ly [facility] and oral clearing w/ well-chopped down solid food trials moistened. He appeared to exhibit a min munching pattern. No gross oral weakness noted during bolus management and oral movements. A-P transfer time was adequate. OM exam appeared [facility]. Pt fed self w/ min setup provided. Recommend a more broken down diet consistency of Dysphagia level 2 diet w/ thin liquids for easier mastication; stamina - education given on avoiding tougher meats, breads not well moistened. Educated on general aspiration precautions; Pills in Puree Whole if  needed for easier swallowing. Pt stated he felt "fine" eating and drinking; unsure of pt's baseline Cognitive status but he followed through and answered basic questions w/ few cues. Of note, pt exhibited MODERATE Esophageal s/s of Reflux and/or Dysmotility c/b Mod Belching and globus feeling. Suspect this could  be baseline for pt d/t ETOH abuse; pt does have dx of Gastritis currently. ANY Esophageal REGURGITATION could increased risk for aspiration of REFLUX material thus impact the Pulmonary status. Recommend f/u w/ GI for assessment and management post discharge. ST services will be available for any further education as needed while admitted.  SLP Visit Diagnosis: Dysphagia, pharyngoesophageal phase (R13.14)(suspect GI dysmotility and impact; gastritis+; Reflux s/s)    Aspiration Risk  Mild aspiration risk(from any REGURGITATED food/liquid/phlegm)    Diet Recommendation     Medication Administration: Whole meds with puree(IF needed for easier, safer swallowing)    Other  Recommendations Recommended Consults: Consider GI evaluation;Consider esophageal assessment(assess for REFLUX; management) Oral Care Recommendations: Oral care BID;Staff/trained caregiver to provide oral care Other Recommendations: (n/a)   Follow up Recommendations None      Frequency and Duration (n/a)  (n/a)       Prognosis Prognosis for Safe Diet Advancement: Fair(-Good) Barriers to Reach Goals: (Esophageal deficits/issues)      Swallow Study   General Date of Onset: 01/29/18 HPI: Pt is a 77 y.o. male with a known history of Protein calorie Malnutrition, CKD, alcohol abuse, aortic stenosis, paroxysmal A. fib and systolic CHF.  The patient presented to ED with above chief complaints.  The patient is not a good historian.  According to Dr. Effie Shy, the son stated that the patient has had worsening shortness of breath and leg swelling for the past couple of weeks.  He has not been taking Lasix as prescribed.  He has cough, shortness of breath and leg swelling but denies any fever or chills.  He is found hypoxia and put on oxygen 2 L.  Chest x-ray show bilateral pleural effusion.  During this admission, GI followed pt for bloody stools; other issues.  An EGD performed revealed: "Patchy mild inflammation characterized by  congestion (edema) and erythema was found in the entire examined stomach"; pt is on a PPI.  He reports increased amount of phlegm and belching.  Pt was admitted w/ Acute respiratory failure with hypoxia due to acute on chronic systolic CHF with ejection fraction 20%.  Unsure of his baseline Cognitive status; mumbled speech and required cues for follow through/direction.  Type of Study: Bedside Swallow Evaluation Previous Swallow Assessment: none reported Diet Prior to this Study: Thin liquids(clear liquid upgraded to solids per MD today) Temperature Spikes Noted: (wbc 8.4; temp 100) Respiratory Status: Nasal cannula(2 liters) History of Recent Intubation: No Behavior/Cognition: Alert;Cooperative;Confused;Pleasant mood;Distractible;Requires cueing Oral Cavity Assessment: Within Functional Limits(adequate) Oral Care Completed by SLP: Recent completion by staff Oral Cavity - Dentition: Missing dentition(few) Vision: Functional for self-feeding Self-Feeding Abilities: Able to feed self;Needs set up Patient Positioning: Upright in bed(needed positioning) Baseline Vocal Quality: Low vocal intensity(grossly adequate) Volitional Cough: Strong;Congested Volitional Swallow: Able to elicit    Oral/Motor/Sensory Function Overall Oral Motor/Sensory Function: Within functional limits(adequate for bolus management and oral clearing)   Ice Chips Ice chips: Not tested Other Comments: pt began drinking an Ensure left for him from NSG   Thin Liquid Thin Liquid: Within functional limits Presentation: Cup;Self Fed;Straw(~8+ ozs) Other Comments: pt expectorated and cleared throat intermittently as he did at baseline prior to po's    Nectar Thick  Nectar Thick Liquid: Not tested   Honey Thick Honey Thick Liquid: Not tested   Puree Puree: Within functional limits Presentation: Self Fed;Spoon(4 ozs)   Solid     Solid: Impaired Presentation: (asssited; 4 trials) Oral Phase Impairments: Impaired  mastication Oral Phase Functional Implications: Impaired mastication(munching) Pharyngeal Phase Impairments: (none)       Jerilynn Som, MS, CCC-SLP Vicki Chaffin 02/05/2018,11:28 AM

## 2018-02-05 NOTE — Progress Notes (Signed)
[employer] [facility] [place] [facility]   PATIENT NAME: Austin FoldsHarry Zamora    MR#:  [id number]  DATE OF BIRTH:  02/18/1941  SUBJECTIVE:   no more bloody stools  REVIEW OF SYSTEMS:    Review of Systems  Constitutional: Negative for fever, chills weight loss HENT: Negative for ear pain, nosebleeds, congestion, facial swelling, rhinorrhea, neck pain, neck stiffness and ear discharge.   Respiratory: Negative for cough, shortness of breath, wheezing  Cardiovascular: Negative for chest pain, palpitations and leg swelling.  Gastrointestinal: Negative for heartburn, abdominal pain, vomiting, diarrhea or consitpation  Genitourinary: Negative for dysuria, urgency, frequency, hematuria Musculoskeletal: Negative for back pain or joint pain Neurological: Negative for dizziness, seizures, syncope, focal weakness,  numbness and headaches.  Hematological: Does not bruise/bleed easily.  Psychiatric/Behavioral: Negative for hallucinations, confusion, dysphoric mood    Tolerating Diet:  yes      DRUG ALLERGIES:  No Known Allergies  VITALS:  Blood pressure 104/89, pulse 77, temperature 98.3 F (36.8 C), temperature source Oral, resp. rate 14, height 6' (1.829 m), weight 56.8 kg, SpO2 100 %.  PHYSICAL EXAMINATION:  Constitutional: Appears well-developed and well-nourished. No distress. HENT: Normocephalic. Marland Kitchen. Oropharynx is clear and moist.  Eyes: Conjunctivae and EOM are normal. PERRLA, no scleral icterus.  Neck: Normal ROM. Neck supple. No JVD. No tracheal deviation. CVS: RRR, S1/S2 +, no murmurs, no gallops, no carotid bruit.  Pulmonary: Effort and breath sounds normal, no stridor, rhonchi, wheezes, rales.  Abdominal: Soft. BS +,  no distension, tenderness, rebound or guarding.  Musculoskeletal: Normal range of motion. No edema and no tenderness.  Neuro: Alert. CN 2-12 grossly intact. No focal deficits. Skin: Skin is warm and dry. No rash noted. Psychiatric: Normal mood and affect.       LABORATORY PANEL:   CBC Recent Labs  Lab 02/05/18 0403  WBC 8.4  HGB 8.6*  HCT 27.0*  PLT 153   ------------------------------------------------------------------------------------------------------------------  Chemistries  Recent Labs  Lab 01/30/18 0408  02/05/18 0403  NA 143   < > 136  K 4.7   < > 4.1  CL 103   < > 95*  CO2 32   < > 36*  GLUCOSE 109*   < > 110*  BUN 28*   < > 32*  CREATININE 1.24   < > 1.05  CALCIUM 9.0   < > 8.6*  MG 1.9  --   --    < > = values in this interval not displayed.   ------------------------------------------------------------------------------------------------------------------  Cardiac Enzymes Recent Labs  Lab 01/29/18 1528 01/30/18 1618 01/30/18 2158  TROPONINI 0.25* 0.21* 0.15*   ------------------------------------------------------------------------------------------------------------------  RADIOLOGY:  No results found.   ASSESSMENT AND PLAN:   77 year old male with history of chronic kidney disease stage III, alcohol abuse, PAF, chronic systolic heart failure presented to the emergency room due to shortness of breath.  1.  Acute hypoxic respiratory failure due to acute on chronic systolic heart failure ejection fraction of 20%: Patient with -11 Liters out Change to PO Lasix today and wean O@ off. He will need CHF clinic upon discharge. 2.  Acute blood loss anemia with rectal bleeding: Patient is status post 1 unit PRBCs.  Colonoscopy shows blood in the stool.  I spoke with GI physician who thought this might be diverticular in nature.  EGD shows gastritis. Hemoglobin this morning is 8.6.  I will monitor 1 more day and if stable patient may be discharged tomorrow. Continue to hold Eliquis for now. 3.  Lower extremity cellulitis: Continue Keflex for 4 more days. He will need outpatient follow-up with vascular surgery for ABIs  4.  Elevated troponin due to demand ischemia Patient has ruled out for ACS  5.   PAF: Due to GI bleed Eliquis on hold Continue rate control with Metoprolol  6.  Chronic kidney disease: Creatinine is stable and at baseline  Patient will be discharged to skilled nursing facility with outpatient palliative care services when medically stable   Management plans discussed with the patient and  he is in agreement.  CODE STATUS: dnr  TOTAL TIME TAKING CARE OF THIS PATIENT: 25 minutes.     POSSIBLE D/C tomorrow DEPENDING ON hemoglobin  Matthieu Loftus M.D on 02/05/2018 at 9:51 AM  Between 7am to 6pm - Pager - [phone] After 6pm go to [email or web address] - password [email or web address]  [facility]  Office  [phone]  CC: Primary care physician; Alwyn Pea, MD  Note: This dictation was prepared with Dragon dictation along with smaller phrase technology. Any transcriptional errors that result from this process are unintentional.

## 2018-02-05 NOTE — Progress Notes (Signed)
PT Cancellation Note  Patient Details Name: Austin IvoryHarry C Tarleton MRN: [id number] DOB: 01/05/1941   Cancelled Treatment:    Reason Eval/Treat Not Completed: Patient at procedure or test/unavailable(Treatment session attempted.  Patient currently with RN/CNA for patient care/ADL routine.  Will re-attempt at later time/date as patient medically appropriate and available.)   Hydie Langan H. Manson PasseyBrown, PT, DPT, NCS 02/05/18, 2:59 PM [phone]

## 2018-02-05 NOTE — Plan of Care (Signed)
  Problem: Clinical Measurements: Goal: Ability to maintain clinical measurements within normal limits will improve Outcome: Progressing   Problem: Safety: Goal: Ability to remain free from injury will improve Outcome: Progressing   

## 2018-02-05 NOTE — Progress Notes (Addendum)
[facility] Gastroenterology Inpatient Progress Note  Subjective: Patient seen for follow-up of presumptive diverticular bleed.  Patient has had no further overt gastrointestinal bleeding today.  Brother and son are at the bedside.  Objective: Vital signs in last 24 hours: Temp:  [98.2 F (36.8 C)-100 F (37.8 C)] 98.7 F (37.1 C) (12/06 1559) Pulse Rate:  [77-96] 84 (12/06 1559) Resp:  [14-20] 16 (12/06 1559) BP: (99-133)/(61-89) 112/65 (12/06 1559) SpO2:  [95 %-100 %] 95 % (12/06 1559) Weight:  [56.8 kg] 56.8 kg (12/06 0434) Blood pressure 112/65, pulse 84, temperature 98.7 F (37.1 C), temperature source Oral, resp. rate 16, height 6' (1.829 m), weight 56.8 kg, SpO2 95 %.    Intake/Output from previous day: 12/05 0701 - 12/06 0700 In: 320 [P.O.:120; I.V.:200] Out: 1025 [Urine:1025]  Intake/Output this shift: Total I/O In: -  Out: 350 [Urine:350]   General appearance: Alert, no distress. Resp: Clear to auscultation Cardio: Regular rate no gallop GI: Soft, benign, nontender bowel sounds positive. Extremities: No edema.   Lab Results: Results for orders placed or performed during the hospital encounter of 01/29/18 (from the past 24 hour(s))  CBC     Status: Abnormal   Collection Time: 02/05/18  4:03 AM  Result Value Ref Range   WBC 8.4 4.0 - 10.5 K/uL   RBC 2.74 (L) 4.22 - 5.81 MIL/uL   Hemoglobin 8.6 (L) 13.0 - 17.0 g/dL   HCT 75.6 (L) 43.3 - 29.5 %   MCV 98.5 80.0 - 100.0 fL   MCH 31.4 26.0 - 34.0 pg   MCHC 31.9 30.0 - 36.0 g/dL   RDW 18.8 (H) 41.6 - 60.6 %   Platelets 153 150 - 400 K/uL   nRBC 0.0 0.0 - 0.2 %  Basic metabolic panel     Status: Abnormal   Collection Time: 02/05/18  4:03 AM  Result Value Ref Range   Sodium 136 135 - 145 mmol/L   Potassium 4.1 3.5 - 5.1 mmol/L   Chloride 95 (L) 98 - 111 mmol/L   CO2 36 (H) 22 - 32 mmol/L   Glucose, Bld 110 (H) 70 - 99 mg/dL   BUN 32 (H) 8 - 23 mg/dL   Creatinine, Ser 3.01 0.61 - 1.24 mg/dL   Calcium  8.6 (L) 8.9 - 10.3 mg/dL   GFR calc non Af Amer >60 >60 mL/min   GFR calc Af Amer >60 >60 mL/min   Anion gap 5 5 - 15     Recent Labs    02/03/18 0413  02/03/18 2201 02/04/18 0941 02/05/18 0403  WBC 5.0  --  5.3  --  8.4  HGB 12.0*   < > 9.4* 9.9* 8.6*  HCT 38.4*   < > 30.0* 31.8* 27.0*  PLT 147*  --  143*  --  153   < > = values in this interval not displayed.   BMET Recent Labs    02/03/18 0413 02/03/18 2201 02/05/18 0403  NA 141 136 136  K 5.7* 4.2 4.1  CL 92* 92* 95*  CO2 45* 39* 36*  GLUCOSE 123* 99 110*  BUN 39* 37* 32*  CREATININE 1.20 0.99 1.05  CALCIUM 8.7* 8.6* 8.6*   LFT No results for input(s): PROT, ALBUMIN, AST, ALT, ALKPHOS, BILITOT, BILIDIR, IBILI in the last 72 hours. PT/INR No results for input(s): LABPROT, INR in the last 72 hours. Hepatitis Panel No results for input(s): HEPBSAG, HCVAB, HEPAIGM, HEPBIGM in the last 72 hours. C-Diff No results for input(s):  CDIFFTOX in the last 72 hours. No results for input(s): CDIFFPCR in the last 72 hours.   Studies/Results: No results found.  Scheduled Inpatient Medications:   . sodium chloride   Intravenous Once  . cephALEXin  250 mg Oral Q6H  . famotidine  20 mg Oral BID  . feeding supplement (ENSURE ENLIVE)  237 mL Oral TID BM  . [START ON 02/06/2018] furosemide  40 mg Oral Daily  . metoprolol tartrate  25 mg Oral BID  . multivitamin-lutein  1 capsule Oral Daily  . pantoprazole  40 mg Oral Daily  . sodium chloride flush  3 mL Intravenous Q12H    Continuous Inpatient Infusions:   . sodium chloride Stopped (02/04/18 1256)    PRN Inpatient Medications:  sodium chloride, acetaminophen **OR** acetaminophen, albuterol, bisacodyl, HYDROcodone-acetaminophen, ondansetron **OR** ondansetron (ZOFRAN) IV, senna-docusate, sodium chloride flush   Assessment/Plan Austin Zamora is a 77 y.o. male with a PMH of aortic stenosis, malnutrition, alcohol and tobacco abuse, paroxysmal atrial fibrillation on Eliquis,  and CKD Stage III admitted to the hospital 11/29 for acute on chronic systolic heart failure with EF 20%. GI consulted for  rectal bleeding.  1. Rectal bleeding 2. Acute blood loss anemia 3. Chronic anticoagulation - Likely diverticular bleed -  He is hemodynamically stable currently. Hg 8.6 today - Continue to monitor H&H. Transfuse for hemoglobin <7.0. Ok with discharge in AM as planned if otherwise stable.    Austin Zamora, M.D. 02/05/2018, 4:09 PM

## 2018-02-06 LAB — CBC
HCT: 27.8 % — ABNORMAL LOW (ref 39.0–52.0)
Hemoglobin: 8.6 g/dL — ABNORMAL LOW (ref 13.0–17.0)
MCH: 31.2 pg (ref 26.0–34.0)
MCHC: 30.9 g/dL (ref 30.0–36.0)
MCV: 100.7 fL — ABNORMAL HIGH (ref 80.0–100.0)
Platelets: 157 10*3/uL (ref 150–400)
RBC: 2.76 MIL/uL — ABNORMAL LOW (ref 4.22–5.81)
RDW: 15.9 % — ABNORMAL HIGH (ref 11.5–15.5)
WBC: 6 10*3/uL (ref 4.0–10.5)
nRBC: 0 % (ref 0.0–0.2)

## 2018-02-06 LAB — BPAM RBC
Blood Product Expiration Date: 201912302359
Unit Type and Rh: 5100

## 2018-02-06 LAB — TYPE AND SCREEN
ABO/RH(D): O POS
Antibody Screen: NEGATIVE
Unit division: 0

## 2018-02-06 LAB — PREPARE RBC (CROSSMATCH)

## 2018-02-06 MED ORDER — PANTOPRAZOLE SODIUM 40 MG PO TBEC: 40.0000 mg | DELAYED_RELEASE_TABLET | Freq: Every day | ORAL | Status: DC

## 2018-02-06 MED ORDER — CEPHALEXIN 250 MG PO CAPS: 250.0000 mg | ORAL_CAPSULE | Freq: Four times a day (QID) | ORAL | 0 refills | Status: AC

## 2018-02-06 NOTE — Discharge Summary (Signed)
[employer] - [place] at [facility]   PATIENT NAME: Austin FoldsHarry Zamora    MR#:  [id number]  DATE OF BIRTH:  11/06/1940  DATE OF ADMISSION:  01/29/2018 ADMITTING PHYSICIAN: Shaune PollackQing Chen, MD  DATE OF DISCHARGE: 02/06/2018  PRIMARY CARE PHYSICIAN: Alwyn Peaallwood, Dwayne D, MD    ADMISSION DIAGNOSIS:  Peripheral edema [R60.9] Pleural effusion [J90] Elevated troponin [R79.89] Other fatigue [R53.83]  DISCHARGE DIAGNOSIS:  Active Problems:   Acute on chronic systolic CHF (congestive heart failure) (HCC)   Pressure injury of skin   SECONDARY DIAGNOSIS:   Past Medical History:  Diagnosis Date  . Acute kidney injury (HCC)   . Acute renal failure (ARF) (HCC)   . Alcohol abuse   . Aortic stenosis   . Nonrheumatic aortic valve stenosis   . Paroxysmal A-fib (HCC)   . Protein calorie malnutrition (HCC)   . SOBOE (shortness of breath on exertion)     HOSPITAL COURSE:   77 year old male with history of chronic kidney disease stage III, alcohol abuse, PAF, chronic systolic heart failure presented to the emergency room due to shortness of breath.  1.  Acute hypoxic respiratory failure due to acute on chronic systolic heart failure ejection fraction of 20%: Patient with -12.8 Liters  HE will continue oral Lasix Change to PO and has been referred to CHF clinic upon discharge.     2.  Acute blood loss anemia with rectal bleeding: Patient is status post 1 unit PRBCs.  Colonoscopy shows blood in the stool.  Etiology of acute blood loss anemia with rectal bleeding is felt to be diverticular in nature.  His hemoglobin has now remained stable at 8.6.  He is currently off of Eliquis and anti-platelet agents.  He will need a repeat CBC in 2 days and have follow-up with cardiology.   Endoscopy showed gastritis and he will be discharged on oral Protonix.     3.  Lower extremity cellulitis: Continue Keflex for 2 more days.  Stop date of Keflex is February 08, 2018.  He will need outpatient  follow-up with vascular surgery for ABIs  4.  Elevated troponin due to demand ischemia Patient has ruled out for ACS  5.  PAF: Due to GI bleed Eliquis on hold. He will have outpatient follow-up with his cardiologist and it will be up to the discretion of the cardiologist to restart Eliquis after repeat CBC is performed. Continue rate control with Metoprolol  6.  Chronic kidney disease: Creatinine is stable and at baseline   DISCHARGE CONDITIONS AND DIET:   Stable for discharge on heart healthy diet  CONSULTS OBTAINED:  Treatment Team:  Alwyn Peaallwood, Dwayne D, MD  DRUG ALLERGIES:  No Known Allergies  DISCHARGE MEDICATIONS:   Allergies as of 02/06/2018   No Known Allergies     Medication List    STOP taking these medications   ELIQUIS 5 MG Tabs tablet Generic drug:  apixaban     TAKE these medications   cephALEXin 250 MG capsule Commonly known as:  KEFLEX Take 1 capsule (250 mg total) by mouth every 6 (six) hours for 2 days.   feeding supplement (ENSURE ENLIVE) Liqd Take 237 mLs by mouth 3 (three) times daily between meals.   furosemide 20 MG tablet Commonly known as:  LASIX Take 20 mg by mouth daily.   lisinopril 20 MG tablet Commonly known as:  PRINIVIL,ZESTRIL Take 20 mg by mouth daily.   metoprolol succinate 50 MG 24 hr tablet Commonly known as:  TOPROL-XL Take  50 mg by mouth daily.   nicotine 14 mg/24hr patch Commonly known as:  NICODERM CQ - dosed in mg/24 hours Place 1 patch (14 mg total) onto the skin daily.   pantoprazole 40 MG tablet Commonly known as:  PROTONIX Take 1 tablet (40 mg total) by mouth daily.   PROAIR HFA 108 (90 Base) MCG/ACT inhaler Generic drug:  albuterol Inhale 2 puffs into the lungs every 4 (four) hours as needed for wheezing or shortness of breath.         Today   CHIEF COMPLAINT:  No more rectal bleeding   VITAL SIGNS:  Blood pressure 134/69, pulse 81, temperature 98 F (36.7 C), temperature source Oral, resp.  rate 18, height 6' (1.829 m), weight 63 kg, SpO2 92 %.   REVIEW OF SYSTEMS:  Review of Systems  Constitutional: Negative.  Negative for chills, fever and malaise/fatigue.  HENT: Negative.  Negative for ear discharge, ear pain, hearing loss, nosebleeds and sore throat.   Eyes: Negative.  Negative for blurred vision and pain.  Respiratory: Negative.  Negative for cough, hemoptysis, shortness of breath and wheezing.   Cardiovascular: Negative.  Negative for chest pain, palpitations and leg swelling.  Gastrointestinal: Negative.  Negative for abdominal pain, blood in stool, diarrhea, nausea and vomiting.  Genitourinary: Negative.  Negative for dysuria.  Musculoskeletal: Negative.  Negative for back pain.  Skin: Negative.   Neurological: Negative for dizziness, tremors, speech change, focal weakness, seizures and headaches.  Endo/Heme/Allergies: Negative.  Does not bruise/bleed easily.  Psychiatric/Behavioral: Negative.  Negative for depression, hallucinations and suicidal ideas.     PHYSICAL EXAMINATION:  GENERAL:  77 y.o.-year-old patient lying in the bed with no acute distress.  NECK:  Supple, no jugular venous distention. No thyroid enlargement, no tenderness.  LUNGS: Normal breath sounds bilaterally, no wheezing, rales,rhonchi  No use of accessory muscles of respiration.  CARDIOVASCULAR: S1, S2 normal. No murmurs, rubs, or gallops.  ABDOMEN: Soft, non-tender, non-distended. Bowel sounds present. No organomegaly or mass.  EXTREMITIES: No pedal edema, cyanosis, or clubbing.  PSYCHIATRIC: The patient is alert and oriented x 3.  SKIN: No obvious rash, lesion, or ulcer.   DATA REVIEW:   CBC Recent Labs  Lab 02/06/18 0420  WBC 6.0  HGB 8.6*  HCT 27.8*  PLT 157    Chemistries  Recent Labs  Lab 02/05/18 0403  NA 136  K 4.1  CL 95*  CO2 36*  GLUCOSE 110*  BUN 32*  CREATININE 1.05  CALCIUM 8.6*    Cardiac Enzymes Recent Labs  Lab 01/30/18 1618 01/30/18 2158   TROPONINI 0.21* 0.15*    Microbiology Results  @MICRORSLT48 @  RADIOLOGY:  No results found.    Allergies as of 02/06/2018   No Known Allergies     Medication List    STOP taking these medications   ELIQUIS 5 MG Tabs tablet Generic drug:  apixaban     TAKE these medications   cephALEXin 250 MG capsule Commonly known as:  KEFLEX Take 1 capsule (250 mg total) by mouth every 6 (six) hours for 2 days.   feeding supplement (ENSURE ENLIVE) Liqd Take 237 mLs by mouth 3 (three) times daily between meals.   furosemide 20 MG tablet Commonly known as:  LASIX Take 20 mg by mouth daily.   lisinopril 20 MG tablet Commonly known as:  PRINIVIL,ZESTRIL Take 20 mg by mouth daily.   metoprolol succinate 50 MG 24 hr tablet Commonly known as:  TOPROL-XL Take 50 mg by mouth  daily.   nicotine 14 mg/24hr patch Commonly known as:  NICODERM CQ - dosed in mg/24 hours Place 1 patch (14 mg total) onto the skin daily.   pantoprazole 40 MG tablet Commonly known as:  PROTONIX Take 1 tablet (40 mg total) by mouth daily.   PROAIR HFA 108 (90 Base) MCG/ACT inhaler Generic drug:  albuterol Inhale 2 puffs into the lungs every 4 (four) hours as needed for wheezing or shortness of breath.          Management plans discussed with the patient and he is in agreement. Stable for discharge snf  Patient should follow up with pcp  CODE STATUS:     Code Status Orders  (From admission, onward)         Start     Ordered   01/29/18 1821  Do not attempt resuscitation (DNR)  Continuous    Question Answer Comment  In the event of cardiac or respiratory ARREST Do not call a "code blue"   In the event of cardiac or respiratory ARREST Do not perform Intubation, CPR, defibrillation or ACLS   In the event of cardiac or respiratory ARREST Use medication by any route, position, wound care, and other measures to relive pain and suffering. May use oxygen, suction and manual treatment of airway  obstruction as needed for comfort.      01/29/18 1820        Code Status History    Date Active Date Inactive Code Status Order ID Comments User Context   09/22/2017 1408 09/22/2017 1931 Full Code [id number]  Lamar Blinks, MD Inpatient   05/17/2017 0644 05/18/2017 2149 Full Code [id number]  Bertrum Sol, MD Inpatient   09/06/2014 0429 09/06/2014 1620 Full Code [id number]  Arnaldo Natal, MD Inpatient      TOTAL TIME TAKING CARE OF THIS PATIENT: 38 minutes.    Note: This dictation was prepared with Dragon dictation along with smaller phrase technology. Any transcriptional errors that result from this process are unintentional.  Hillel Card M.D on 02/06/2018 at 10:14 AM  Between 7am to 6pm - Pager - [phone] After 6pm go to [email or web address] - Social research officer, government  [employer]  [employer]  Office  [phone]  CC: Primary care physician; Alwyn Pea, MD

## 2018-02-06 NOTE — Progress Notes (Signed)
Report given to [facility]inda at [facility].

## 2018-02-06 NOTE — Progress Notes (Signed)
Physical Therapy Treatment Patient Details Name: Austin IvoryHarry C Allbright MRN: [id number] DOB: 02/05/1941 Today's Date: 02/06/2018    History of Present Illness 77 yo male with onset of acute CHF was admitted, referred to PT for mobility and note edema on feet, generalized weakness and AKI, ARF, elevated troponin and demand ischemia.  Has atelectasis, pleural effusion, on O2 3L with fatigue of effort, BLE cellulitis.  PMHx:  EF 20%, CHF, PAD, CKD 3, EtOH abuse, aortic stenosis, SOB with exertion, PAF, HTN,     PT Comments    Pt awake in bed.  Agreed to out of bed with encouragement for breakfast.  To edge of bed with mod a x 1.  Once sitting, able to sit unsupported.  He required multiple attempts to stand to walker with poor hand placements with mod verbal and tactile cues.  He was able to stand and transfer to recliner at bedside with min a x 1 and sat quickly in chair despite encouragement to remain standing.  Pt reports feeling generally weak. Remained in recliner for breakfast.   Follow Up Recommendations  SNF     Equipment Recommendations       Recommendations for Other Services       Precautions / Restrictions Precautions Precautions: Fall Precaution Comments: telemetry Restrictions Weight Bearing Restrictions: No    Mobility  Bed Mobility Overal bed mobility: Needs Assistance Bed Mobility: Supine to Sit     Supine to sit: Mod assist     General bed mobility comments: assisted to side of bed with help to move legs then support trunk to sit up  Transfers Overall transfer level: Needs assistance Equipment used: Rolling walker (2 wheeled);1 person hand held assist Transfers: Sit to/from Stand Sit to Stand: Mod assist         General transfer comment: cues and multiple attempts to stand  Ambulation/Gait Ambulation/Gait assistance: Min assist Gait Distance (Feet): 3 Feet Assistive device: Rolling walker (2 wheeled) Gait Pattern/deviations: Step-to  pattern;Shuffle;Decreased stride length;Wide base of support Gait velocity: reduced       Stairs             Wheelchair Mobility    Modified Rankin (Stroke Patients Only)       Balance Overall balance assessment: Needs assistance;History of Falls Sitting-balance support: Feet supported;Bilateral upper extremity supported Sitting balance-Leahy Scale: Fair     Standing balance support: Bilateral upper extremity supported;During functional activity Standing balance-Leahy Scale: Poor                              Cognition Arousal/Alertness: Awake/alert Behavior During Therapy: WFL for tasks assessed/performed Overall Cognitive Status: No family/caregiver present to determine baseline cognitive functioning                                        Exercises      General Comments        Pertinent Vitals/Pain Pain Assessment: No/denies pain    Home Living                      Prior Function            PT Goals (current goals can now be found in the care plan section) Progress towards PT goals: Progressing toward goals    Frequency    Min 2X/week  PT Plan Current plan remains appropriate    Co-evaluation              AM-PAC PT "6 Clicks" Mobility   Outcome Measure  Help needed turning from your back to your side while in a flat bed without using bedrails?: A Little Help needed moving from lying on your back to sitting on the side of a flat bed without using bedrails?: A Lot Help needed moving to and from a bed to a chair (including a wheelchair)?: A Little Help needed standing up from a chair using your arms (e.g., wheelchair or bedside chair)?: A Lot Help needed to walk in hospital room?: A Lot Help needed climbing 3-5 steps with a railing? : Total 6 Click Score: 13    End of Session Equipment Utilized During Treatment: Gait belt Activity Tolerance: Patient tolerated treatment well;Patient limited  by fatigue Patient left: in chair;with call bell/phone within reach;with chair alarm set         Time: 8469-6295 PT Time Calculation (min) (ACUTE ONLY): 8 min  Charges:  $Therapeutic Activity: 8-22 mins                     Danielle Dess, PTA 02/06/18, 9:21 AM

## 2018-02-06 NOTE — Clinical Social Work Note (Signed)
The patient will discharge today to [facility] via family transportation. The patient and his son are aware and in agreement, and the facility is aware and has received all discharge paperwork. The CSW has delivered the discharge packet including the DNR form and EMS documentation should the family choose not to transport for safety reasons. The CSW is signing off. Please consult should additional needs arise.  Argentina PonderKaren Martha Vadim Centola, MSW, Theresia MajorsLCSWA [phone]

## 2018-02-06 NOTE — Progress Notes (Signed)
Patient given discharge instructions with son at bedside. Patient and son verbalized understanding with no questions or concerns. Son transporting patient to [facility]. IV taken out and tele monitor off.

## 2018-02-06 NOTE — Plan of Care (Signed)

## 2018-02-08 ENCOUNTER — Telehealth: Payer: Self-pay | Admitting: Family

## 2018-02-08 ENCOUNTER — Ambulatory Visit: Payer: Medicare Other | Admitting: Family

## 2018-02-08 NOTE — Telephone Encounter (Signed)
Patient did not show for his Heart Failure Clinic appointment on 02/08/18.. Will attempt to reschedule.  

## 2018-02-09 NOTE — Anesthesia Postprocedure Evaluation (Signed)
Anesthesia Post Note  Patient: Austin Zamora  Procedure(s) Performed: COLONOSCOPY (N/A ) ESOPHAGOGASTRODUODENOSCOPY (EGD) (N/A )  Patient location during evaluation: Endoscopy Anesthesia Type: General Level of consciousness: awake and alert Pain management: pain level controlled Vital Signs Assessment: post-procedure vital signs reviewed and stable Respiratory status: spontaneous breathing, nonlabored ventilation and respiratory function stable Cardiovascular status: blood pressure returned to baseline and stable Postop Assessment: no apparent nausea or vomiting Anesthetic complications: no     Last Vitals:  Vitals:   02/06/18 0622 02/06/18 0758  BP: 135/89 134/69  Pulse: 84 81  Resp: 17 18  Temp: 36.8 C 36.7 C  SpO2: 97% 92%    Last Pain:  Vitals:   02/06/18 0758  TempSrc: Oral  PainSc: 0-No pain                 Christia ReadingScott T Kasandra Fehr

## 2018-02-15 LAB — ABO/RH: ABO/RH(D): O POS

## 2018-03-10 ENCOUNTER — Encounter (INDEPENDENT_AMBULATORY_CARE_PROVIDER_SITE_OTHER): Payer: Medicare Other | Admitting: Nurse Practitioner

## 2018-03-10 ENCOUNTER — Other Ambulatory Visit (INDEPENDENT_AMBULATORY_CARE_PROVIDER_SITE_OTHER): Payer: Self-pay | Admitting: Vascular Surgery

## 2018-03-10 ENCOUNTER — Encounter (INDEPENDENT_AMBULATORY_CARE_PROVIDER_SITE_OTHER): Payer: Medicare Other

## 2018-03-10 DIAGNOSIS — M79604 Pain in right leg: Secondary | ICD-10-CM

## 2018-03-10 DIAGNOSIS — M79605 Pain in left leg: Principal | ICD-10-CM

## 2020-03-07 ENCOUNTER — Emergency Department
Admission: EM | Admit: 2020-03-07 | Discharge: 2020-03-07 | Disposition: A | Discharge status: Home / Self Care | Payer: Medicare Other | Attending: Emergency Medicine | Admitting: Emergency Medicine

## 2020-03-07 ENCOUNTER — Emergency Department: Payer: Medicare Other

## 2020-03-07 ENCOUNTER — Other Ambulatory Visit: Payer: Self-pay

## 2020-03-07 DIAGNOSIS — I5023 Acute on chronic systolic (congestive) heart failure: Secondary | ICD-10-CM | POA: Diagnosis not present

## 2020-03-07 DIAGNOSIS — Y92512 Supermarket, store or market as the place of occurrence of the external cause: Secondary | ICD-10-CM | POA: Diagnosis not present

## 2020-03-07 DIAGNOSIS — Z955 Presence of coronary angioplasty implant and graft: Secondary | ICD-10-CM | POA: Diagnosis not present

## 2020-03-07 DIAGNOSIS — W228XXA Striking against or struck by other objects, initial encounter: Secondary | ICD-10-CM | POA: Diagnosis not present

## 2020-03-07 DIAGNOSIS — S0990XA Unspecified injury of head, initial encounter: Secondary | ICD-10-CM | POA: Insufficient documentation

## 2020-03-07 DIAGNOSIS — S12110A Anterior displaced Type II dens fracture, initial encounter for closed fracture: Secondary | ICD-10-CM

## 2020-03-07 DIAGNOSIS — S12100A Unspecified displaced fracture of second cervical vertebra, initial encounter for closed fracture: Secondary | ICD-10-CM | POA: Insufficient documentation

## 2020-03-07 DIAGNOSIS — Z87891 Personal history of nicotine dependence: Secondary | ICD-10-CM | POA: Insufficient documentation

## 2020-03-07 DIAGNOSIS — S199XXA Unspecified injury of neck, initial encounter: Secondary | ICD-10-CM | POA: Diagnosis present

## 2020-03-07 HISTORY — DX: Anterior displaced type ii dens fracture, initial encounter for closed fracture: S12.110A

## 2020-03-07 MED ORDER — TRAMADOL HCL 50 MG PO TABS: 50.0000 mg | ORAL_TABLET | Freq: Three times a day (TID) | ORAL | 0 refills | Status: DC | PRN

## 2020-03-07 NOTE — Discharge Instructions (Addendum)
It is very important that you follow up with the neurosurgery team. Please give them a call tomorrow morning to schedule a follow up visit. Please seek medical attention for any high fevers, chest pain, shortness of breath, change in behavior, persistent vomiting, bloody stool or any other new or concerning symptoms.

## 2020-03-07 NOTE — ED Triage Notes (Addendum)
Pt comes pov after mechanical fall. C/o head and neck pain. Says he is on thinners. PERRLA.

## 2020-03-07 NOTE — ED Provider Notes (Signed)
[facility] Emergency Department Provider Note   ____________________________________________   I have reviewed the triage vital signs and the nursing notes.   HISTORY  Chief Complaint Fall   History limited by: Not Limited   HPI Austin Zamora is a 80 y.o. male who presents to the emergency department today because of concern for continued and worsening neck pain. The patient states he had a fall two days ago as he was exiting a grocery store. [place] forwards and hit the top of his head. He denies any significant headache, although has had worsening neck pain since the incident. He denies any tingling/numbness or weakness in any extremity. Denies any change in vision or speech. The patient denies any recent illness. States he is on a blood thinner but is unsure which one or why it was prescribed to him.    Records reviewed. Per medical record review patient has a history of AKI, paroxysmal a-fib.  Past Medical History:  Diagnosis Date  . Acute kidney injury (HCC)   . Acute renal failure (ARF) (HCC)   . Alcohol abuse   . Aortic stenosis   . Nonrheumatic aortic valve stenosis   . Paroxysmal A-fib (HCC)   . Protein calorie malnutrition (HCC)   . SOBOE (shortness of breath on exertion)     Patient Active Problem List   Diagnosis Date Noted  . Pressure injury of skin 01/30/2018  . Acute on chronic systolic CHF (congestive heart failure) (HCC) 01/29/2018  . Acute renal failure (ARF) (HCC)   . Acute kidney injury (HCC)   . Nonrheumatic aortic valve stenosis   . Influenza A 05/17/2017  . Protein-calorie malnutrition, severe 05/17/2017  . Paroxysmal A-fib (HCC) 04/06/2017  . SOBOE (shortness of breath on exertion) 04/06/2017  . Alcohol abuse 10/16/2014  . Aortic stenosis 09/06/2014    Past Surgical History:  Procedure Laterality Date  . COLONOSCOPY N/A 02/04/2018   Procedure: COLONOSCOPY;  Surgeon: Toledo, Boykin Nearing, MD;  Location: [facility] ENDOSCOPY;   Service: Gastroenterology;  Laterality: N/A;  . ESOPHAGOGASTRODUODENOSCOPY N/A 02/04/2018   Procedure: ESOPHAGOGASTRODUODENOSCOPY (EGD);  Surgeon: Toledo, Boykin Nearing, MD;  Location: [facility] ENDOSCOPY;  Service: Gastroenterology;  Laterality: N/A;  . RIGHT/LEFT HEART CATH AND CORONARY ANGIOGRAPHY Bilateral 09/22/2017   Procedure: RIGHT/LEFT HEART CATH AND CORONARY ANGIOGRAPHY;  Surgeon: Lamar Blinks, MD;  Location: [facility] INVASIVE CV LAB;  Service: Cardiovascular;  Laterality: Bilateral;    Prior to Admission medications   Medication Sig Start Date End Date Taking? Authorizing Provider  feeding supplement, ENSURE ENLIVE, (ENSURE ENLIVE) LIQD Take 237 mLs by mouth 3 (three) times daily between meals. 05/18/17   Adrian Saran, MD  furosemide (LASIX) 20 MG tablet Take 20 mg by mouth daily.  04/06/17   [provider]  lisinopril (PRINIVIL,ZESTRIL) 20 MG tablet Take 20 mg by mouth daily.  05/15/17   [provider]  metoprolol succinate (TOPROL-XL) 50 MG 24 hr tablet Take 50 mg by mouth daily.  04/06/17   [provider]  nicotine (NICODERM CQ - DOSED IN MG/24 HOURS) 14 mg/24hr patch Place 1 patch (14 mg total) onto the skin daily. 05/18/17   Adrian Saran, MD  pantoprazole (PROTONIX) 40 MG tablet Take 1 tablet (40 mg total) by mouth daily. 02/06/18   Adrian Saran, MD  PROAIR HFA 108 (90 Base) MCG/ACT inhaler Inhale 2 puffs into the lungs every 4 (four) hours as needed for wheezing or shortness of breath.  05/15/17   [provider]  Allergies Patient has no known allergies.  Family History  Problem Relation Age of Onset  . Breast cancer Mother   . Parkinson's disease Father     Social History Social History   Tobacco Use  . Smoking status: Former Smoker    Packs/day: 0.50    Years: 60.00    Pack years: 30.00    Types: Cigarettes  . Smokeless tobacco: Never Used  . Tobacco comment: quit briefly in march  Substance Use Topics  . Alcohol use: Yes    Alcohol/week:  2.0 standard drinks    Types: 2 Cans of beer per week    Comment: Drinks two 40's daily  . Drug use: No    Review of Systems Constitutional: No fever/chills Eyes: No visual changes. ENT: No sore throat. Cardiovascular: Denies chest pain. Respiratory: Denies shortness of breath. Gastrointestinal: No abdominal pain.  No nausea, no vomiting.  No diarrhea.   Genitourinary: Negative for dysuria. Musculoskeletal: Positive for neck pain. Skin: Negative for rash. Neurological: Negative for headaches, focal weakness or numbness.  ____________________________________________   PHYSICAL EXAM:  VITAL SIGNS: ED Triage Vitals  Enc Vitals Group     BP 03/07/20 1405 138/85     Pulse Rate 03/07/20 1405 98     Resp 03/07/20 1405 18     Temp 03/07/20 1405 98.3 F (36.8 C)     Temp Source 03/07/20 1405 Oral     SpO2 03/07/20 1405 100 %     Weight 03/07/20 1403 138 lb 14.2 oz (63 kg)     Height 03/07/20 1403 6' (1.829 m)     Head Circumference --      [facility] --      Pain Score 03/07/20 1403 8   Constitutional: Alert and oriented.  Eyes: Conjunctivae are normal.  ENT      Head: Normocephalic and atraumatic.      Nose: No congestion/rhinnorhea.      Mouth/Throat: Mucous membranes are moist.      Neck: No stridor. In c-collar. Hematological/Lymphatic/Immunilogical: No cervical lymphadenopathy. Cardiovascular: Normal rate, regular rhythm.  No murmurs, rubs, or gallops.  Respiratory: Normal respiratory effort without tachypnea nor retractions. Breath sounds are clear and equal bilaterally. No wheezes/rales/rhonchi. Gastrointestinal: Soft and non tender. No rebound. No guarding.  Genitourinary: Deferred Musculoskeletal: Normal range of motion in all extremities. No lower extremity edema. Neurologic:  Normal speech and language. EOMI. PERRL. Strength 5/5 in upper and lower extremities. Sensation intact.  Skin:  Skin is warm, dry and intact. No rash noted. Psychiatric: Mood and affect are  normal. Speech and behavior are normal. Patient exhibits appropriate insight and judgment.  ____________________________________________    LABS (pertinent positives/negatives)  None  ____________________________________________   EKG  None  ____________________________________________    RADIOLOGY  CT head/cervical spine Fracture through odontoid with mild displacement.   ____________________________________________   PROCEDURES  Procedures  ____________________________________________   INITIAL IMPRESSION / ASSESSMENT AND PLAN / ED COURSE  Pertinent labs & imaging results that were available during my care of the patient were reviewed by me and considered in my medical decision making (see chart for details).   Patient presented to the emergency department today because of concerns for worsening neck pain 2 days after a fall.  CT here does show fracture of the odontoid process.  No neurological deficits. Dr. Adriana Simas with neurosurgery was consulted over the phone.  Patient was placed in a c-collar.  Will discharge home to follow-up with neurosurgery.  ____________________________________________   FINAL  CLINICAL IMPRESSION(S) / ED DIAGNOSES  Final diagnoses:  Closed displaced fracture of second cervical vertebra, unspecified fracture morphology, initial encounter [facility])     Note: This dictation was prepared with Dragon dictation. Any transcriptional errors that result from this process are unintentional     Nance Pear, MD 03/07/20 1921

## 2020-03-07 NOTE — Consult Note (Cosign Needed Addendum)
Referring Physician:  No referring provider defined for this encounter.  Primary Physician:  Yolonda Kida, MD  Chief Complaint: mechanical fall with resultant type II odontoid fracture  History of Present Illness: Austin Zamora is a 80 y.o. male who suffered a mechanical fall and fell face forward onto the concrete. He experienced neck pain since the fall, non radiating, and presented to the ED for evaluation. A cervical CT was completed and did show the following: "There is a fracture through the inferior aspect of the odontoid with 3 mm of displacement of the more superior odontoid with respect to the remainder of C2".  On exam, he is neurologically intact and he denies any radicular pain.  He is in a hard cervical collar.   Jacquelin Hawking has no symptoms of cervical myelopathy.   Review of Systems:  A 10 point review of systems is negative, except for the pertinent positives and negatives detailed in the HPI.  Past Medical History: Past Medical History:  Diagnosis Date  . Acute kidney injury ([place])   . Acute renal failure (ARF) ([place])   . Alcohol abuse   . Aortic stenosis   . Nonrheumatic aortic valve stenosis   . Paroxysmal A-fib ([place])   . Protein calorie malnutrition ([place])   . SOBOE (shortness of breath on exertion)     Past Surgical History: Past Surgical History:  Procedure Laterality Date  . COLONOSCOPY N/A 02/04/2018   Procedure: COLONOSCOPY;  Surgeon: Toledo, Benay Pike, MD;  Location: [facility];  Service: Gastroenterology;  Laterality: N/A;  . ESOPHAGOGASTRODUODENOSCOPY N/A 02/04/2018   Procedure: ESOPHAGOGASTRODUODENOSCOPY (EGD);  Surgeon: Toledo, Benay Pike, MD;  Location: [facility] ENDOSCOPY;  Service: Gastroenterology;  Laterality: N/A;  . RIGHT/LEFT HEART CATH AND CORONARY ANGIOGRAPHY Bilateral 09/22/2017   Procedure: RIGHT/LEFT HEART CATH AND CORONARY ANGIOGRAPHY;  Surgeon: Corey Skains, MD;  Location: [place] [facility];  Service: Cardiovascular;   Laterality: Bilateral;    Allergies: Allergies as of 03/07/2020  . (No Known Allergies)    Medications: No current facility-administered medications for this encounter.  Current Outpatient Medications:  .  feeding supplement, ENSURE ENLIVE, (ENSURE ENLIVE) LIQD, Take 237 mLs by mouth 3 (three) times daily between meals., Disp: 237 mL, Rfl: 12 .  furosemide (LASIX) 20 MG tablet, Take 20 mg by mouth daily. , Disp: , Rfl:  .  lisinopril (PRINIVIL,ZESTRIL) 20 MG tablet, Take 20 mg by mouth daily. , Disp: , Rfl:  .  metoprolol succinate (TOPROL-XL) 50 MG 24 hr tablet, Take 50 mg by mouth daily. , Disp: , Rfl:  .  nicotine (NICODERM CQ - DOSED IN MG/24 HOURS) 14 mg/24hr patch, Place 1 patch (14 mg total) onto the skin daily., Disp: 28 patch, Rfl: 0 .  pantoprazole (PROTONIX) 40 MG tablet, Take 1 tablet (40 mg total) by mouth daily., Disp: , Rfl:  .  PROAIR HFA 108 (90 Base) MCG/ACT inhaler, Inhale 2 puffs into the lungs every 4 (four) hours as needed for wheezing or shortness of breath. , Disp: , Rfl:    Social History: Social History   Tobacco Use  . Smoking status: Former Smoker    Packs/day: 0.50    Years: 60.00    Pack years: 30.00    Types: Cigarettes  . Smokeless tobacco: Never Used  . Tobacco comment: quit briefly in march  Substance Use Topics  . Alcohol use: Yes    Alcohol/week: 2.0 standard drinks    Types: 2 Cans of beer per week  Comment: Drinks two 40's daily  . Drug use: No    Family Medical History: Family History  Problem Relation Age of Onset  . Breast cancer Mother   . Parkinson's disease Father     Physical Examination: Vitals:   03/07/20 1405 03/07/20 1530  BP: 138/85 (!) 171/93  Pulse: 98 84  Resp: 18 18  Temp: 98.3 F (36.8 C)   SpO2: 100% 100%     General: Patient is well developed, well nourished, calm, collected, and in no apparent distress.  Psychiatric: Patient is non-anxious.  Head:  Pupils equal, round, and reactive to  light.  ENT:  Oral mucosa appears well hydrated.  Neck:   In hard collar    NEUROLOGICAL:  General: In no acute distress.   Awake, alert, oriented to person, place, and time.  Pupils equal round and reactive to light.  Facial tone is symmetric.  Tongue protrusion is midline.  There is no pronator drift.  ROM of spine: deferred due to fracture  Palpation of spine: tender to light palpation of posterior cervical spine and skull base.  Strength: Side Biceps Triceps Deltoid Interossei Grip Wrist Ext. Wrist Flex.  R 5 5 5 5 5 5 5   L 5 5 5 5 5 5 5    Side Iliopsoas Quads Hamstring PF DF EHL  R 5 5 5 5 5 5   L 5 5 5 5 5 5    Reflexes are 1+ and symmetric at the biceps, triceps, brachioradialis, patella and achilles.   Bilateral upper and lower extremity sensation is intact to light touch.  Clonus is not present.  Gait is deferred.    Hoffman's is absent.  Imaging: CLINICAL DATA:  Pain following fall  EXAM: CT HEAD WITHOUT CONTRAST  CT CERVICAL SPINE WITHOUT CONTRAST  TECHNIQUE: Multidetector CT imaging of the head and cervical spine was performed following the standard protocol without intravenous contrast. Multiplanar CT image reconstructions of the cervical spine were also generated.  COMPARISON:  None.  FINDINGS: CT HEAD FINDINGS  Brain: There is moderate diffuse atrophy. There is no intracranial mass, hemorrhage, extra-axial fluid collection, or midline shift. There is small vessel disease in the centra semiovale bilaterally. 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 No acute appearing infarct is evident.  Vascular: No hyperdense vessel. There is calcification in each carotid siphon region.  Skull: Bony calvarium  appears intact.  Sinuses/Orbits: There is mucosal thickening in several ethmoid air cells. There is a small retention cyst in the left sphenoid sinus posteriorly. Other visualized paranasal sinuses are clear. Visualized  orbits appear symmetric bilaterally.  Other: Mastoid air cells are clear.  CT CERVICAL SPINE FINDINGS  Alignment: There is 1 mm of retrolisthesis of C5 on C6. There is 1 mm of retrolisthesis of C6 on C7. No other spondylolisthesis.  Skull base and vertebrae: There is a fracture through the inferior aspect of the odontoid with 3 mm of displacement of the more superior odontoid with respect to the remainder of C2. There is mild displacement of fracture fragments in this area as well. No other fractures are evident. No blastic or lytic bone lesions. Bones are somewhat osteoporotic. Skull base appears unremarkable.  Soft tissues and spinal canal: The prevertebral soft tissues and predental space regions are normal. There is no appreciable cord or canal hematoma. No paraspinous lesions are evident.  Disc levels: There is moderately severe disc space narrowing at C5-6. Other disc spaces appear unremarkable. There is facet osteoarthritic change at  several levels. No frank disc extrusion or stenosis. Exit foraminal narrowing due to bony hypertrophy is noted at C5-6 bilaterally and to a lesser degree at C6-7 on the left.  Upper chest: Visualized upper lung regions are clear.  Other: Foci of calcification noted in each carotid artery.  IMPRESSION: CT head: Atrophy with periventricular small vessel disease. Small lacunar infarct left thalamus. No mass or hemorrhage. No acute appearing infarct.  There are foci of arterial vascular calcification. There is paranasal sinus disease at several sites.  CT cervical spine:  1. Fracture through the inferior odontoid with mild displacement of fracture fragments in this area.  2. 1 mm of retrolisthesis of C5 on C6. 1 mm of retrolisthesis of C6 on C7. These foci of spondylolisthesis are felt to be due to underlying arthropathic change.  3. 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 No disc extrusion or stenosis.  4.  Carotid  artery calcification bilaterally.  These results will be called to the ordering clinician or representative by the Radiologist Assistant, and communication documented in the PACS or [employer].  Assessment and Plan: Mr. Normoyle is a pleasant 80 y.o. male who has suffered a mechanical fall and has a resultant type II odontoid fracture.  Reassuringly, he is neurologically intact and there is no acute neurosurgical intervention indicated at this time.  He should remain in the cervical collar at all times.  We will have him follow up in our clinic in two weeks for repeat xrays.   I discussed with him the need to seek emergency medical help if he develops any numbness, tingling, difficulty with balance, or weakness, as these could be signs of injury to spinal cord.   He verbalized understanding.  Please send him with adequate pain medication until his follow up appointment in two weeks with me.    Thank you for this consult.   Patsey Berthold, NP Dept. of Neurosurgery

## 2020-03-07 NOTE — ED Notes (Signed)
[place] cervical brace placed with assistance from 2 other RNs; pt tolerated well.

## 2020-12-03 ENCOUNTER — Inpatient Hospital Stay
Admission: EM | Admit: 2020-12-03 | Discharge: 2020-12-07 | DRG: 871 | Disposition: A | Discharge status: Hospice | Payer: Medicare Other | Attending: Internal Medicine | Admitting: Internal Medicine

## 2020-12-03 ENCOUNTER — Observation Stay: Payer: Medicare Other

## 2020-12-03 ENCOUNTER — Other Ambulatory Visit: Payer: Self-pay

## 2020-12-03 ENCOUNTER — Emergency Department: Payer: Medicare Other

## 2020-12-03 DIAGNOSIS — I083 Combined rheumatic disorders of mitral, aortic and tricuspid valves: Secondary | ICD-10-CM | POA: Diagnosis present

## 2020-12-03 DIAGNOSIS — Z20822 Contact with and (suspected) exposure to covid-19: Secondary | ICD-10-CM | POA: Diagnosis present

## 2020-12-03 DIAGNOSIS — J159 Unspecified bacterial pneumonia: Secondary | ICD-10-CM | POA: Diagnosis present

## 2020-12-03 DIAGNOSIS — F1020 Alcohol dependence, uncomplicated: Secondary | ICD-10-CM | POA: Diagnosis present

## 2020-12-03 DIAGNOSIS — F1010 Alcohol abuse, uncomplicated: Secondary | ICD-10-CM | POA: Diagnosis present

## 2020-12-03 DIAGNOSIS — I480 Paroxysmal atrial fibrillation: Secondary | ICD-10-CM | POA: Diagnosis present

## 2020-12-03 DIAGNOSIS — I5022 Chronic systolic (congestive) heart failure: Secondary | ICD-10-CM | POA: Diagnosis present

## 2020-12-03 DIAGNOSIS — R55 Syncope and collapse: Secondary | ICD-10-CM

## 2020-12-03 DIAGNOSIS — I248 Other forms of acute ischemic heart disease: Secondary | ICD-10-CM | POA: Diagnosis present

## 2020-12-03 DIAGNOSIS — Z681 Body mass index (BMI) 19 or less, adult: Secondary | ICD-10-CM

## 2020-12-03 DIAGNOSIS — Z9114 Patient's other noncompliance with medication regimen: Secondary | ICD-10-CM

## 2020-12-03 DIAGNOSIS — A419 Sepsis, unspecified organism: Secondary | ICD-10-CM | POA: Diagnosis present

## 2020-12-03 DIAGNOSIS — D75838 Other thrombocytosis: Secondary | ICD-10-CM | POA: Diagnosis present

## 2020-12-03 DIAGNOSIS — E43 Unspecified severe protein-calorie malnutrition: Secondary | ICD-10-CM | POA: Diagnosis present

## 2020-12-03 DIAGNOSIS — R7989 Other specified abnormal findings of blood chemistry: Secondary | ICD-10-CM | POA: Diagnosis present

## 2020-12-03 DIAGNOSIS — I350 Nonrheumatic aortic (valve) stenosis: Secondary | ICD-10-CM

## 2020-12-03 DIAGNOSIS — E860 Dehydration: Secondary | ICD-10-CM | POA: Diagnosis present

## 2020-12-03 DIAGNOSIS — F17210 Nicotine dependence, cigarettes, uncomplicated: Secondary | ICD-10-CM | POA: Diagnosis present

## 2020-12-03 DIAGNOSIS — R9389 Abnormal findings on diagnostic imaging of other specified body structures: Secondary | ICD-10-CM

## 2020-12-03 DIAGNOSIS — Z79899 Other long term (current) drug therapy: Secondary | ICD-10-CM | POA: Diagnosis not present

## 2020-12-03 DIAGNOSIS — K219 Gastro-esophageal reflux disease without esophagitis: Secondary | ICD-10-CM | POA: Diagnosis present

## 2020-12-03 DIAGNOSIS — I130 Hypertensive heart and chronic kidney disease with heart failure and stage 1 through stage 4 chronic kidney disease, or unspecified chronic kidney disease: Secondary | ICD-10-CM | POA: Diagnosis present

## 2020-12-03 DIAGNOSIS — Z7189 Other specified counseling: Secondary | ICD-10-CM | POA: Diagnosis not present

## 2020-12-03 DIAGNOSIS — I712 Thoracic aortic aneurysm, without rupture, unspecified: Secondary | ICD-10-CM | POA: Diagnosis present

## 2020-12-03 DIAGNOSIS — J9601 Acute respiratory failure with hypoxia: Secondary | ICD-10-CM | POA: Diagnosis present

## 2020-12-03 DIAGNOSIS — N1831 Chronic kidney disease, stage 3a: Secondary | ICD-10-CM | POA: Diagnosis present

## 2020-12-03 DIAGNOSIS — R778 Other specified abnormalities of plasma proteins: Secondary | ICD-10-CM | POA: Diagnosis present

## 2020-12-03 DIAGNOSIS — I272 Pulmonary hypertension, unspecified: Secondary | ICD-10-CM | POA: Diagnosis present

## 2020-12-03 DIAGNOSIS — N179 Acute kidney failure, unspecified: Secondary | ICD-10-CM | POA: Diagnosis present

## 2020-12-03 DIAGNOSIS — R0902 Hypoxemia: Secondary | ICD-10-CM

## 2020-12-03 DIAGNOSIS — R64 Cachexia: Secondary | ICD-10-CM | POA: Diagnosis present

## 2020-12-03 DIAGNOSIS — Z66 Do not resuscitate: Secondary | ICD-10-CM | POA: Diagnosis present

## 2020-12-03 HISTORY — DX: Nonrheumatic mitral (valve) insufficiency: I34.0

## 2020-12-03 HISTORY — DX: Chronic systolic (congestive) heart failure: I50.22

## 2020-12-03 HISTORY — DX: Essential (primary) hypertension: I10

## 2020-12-03 LAB — COMPREHENSIVE METABOLIC PANEL
ALT: 16 U/L (ref 0–44)
AST: 47 U/L — ABNORMAL HIGH (ref 15–41)
Albumin: 3.6 g/dL (ref 3.5–5.0)
Alkaline Phosphatase: 83 U/L (ref 38–126)
Anion gap: 17 — ABNORMAL HIGH (ref 5–15)
BUN: 37 mg/dL — ABNORMAL HIGH (ref 8–23)
CO2: 18 mmol/L — ABNORMAL LOW (ref 22–32)
Calcium: 9.8 mg/dL (ref 8.9–10.3)
Chloride: 98 mmol/L (ref 98–111)
Creatinine, Ser: 1.45 mg/dL — ABNORMAL HIGH (ref 0.61–1.24)
GFR, Estimated: 49 mL/min — ABNORMAL LOW (ref >60)
Glucose, Bld: 115 mg/dL — ABNORMAL HIGH (ref 70–99)
Potassium: 4.3 mmol/L (ref 3.5–5.1)
Sodium: 133 mmol/L — ABNORMAL LOW (ref 135–145)
Total Bilirubin: 1.6 mg/dL — ABNORMAL HIGH (ref 0.3–1.2)
Total Protein: 6.9 g/dL (ref 6.5–8.1)

## 2020-12-03 LAB — LACTIC ACID, PLASMA
Lactic Acid, Venous: 2 mmol/L (ref 0.5–1.9)
Lactic Acid, Venous: 2 mmol/L (ref 0.5–1.9)
Lactic Acid, Venous: 1.2 mmol/L (ref 0.5–1.9)

## 2020-12-03 LAB — CBC WITH DIFFERENTIAL/PLATELET
Abs Immature Granulocytes: 0.04 10*3/uL (ref 0.00–0.07)
Basophils Absolute: 0 10*3/uL (ref 0.0–0.1)
Basophils Relative: 0 %
Eosinophils Absolute: 0 10*3/uL (ref 0.0–0.5)
Eosinophils Relative: 0 %
HCT: 45.2 % (ref 39.0–52.0)
Hemoglobin: 13.9 g/dL (ref 13.0–17.0)
Immature Granulocytes: 0 %
Lymphocytes Relative: 4 %
Lymphs Abs: 0.4 10*3/uL — ABNORMAL LOW (ref 0.7–4.0)
MCH: 26.2 pg (ref 26.0–34.0)
MCHC: 30.8 g/dL (ref 30.0–36.0)
MCV: 85.1 fL (ref 80.0–100.0)
Monocytes Absolute: 0.8 10*3/uL (ref 0.1–1.0)
Monocytes Relative: 7 %
Neutro Abs: 10.6 10*3/uL — ABNORMAL HIGH (ref 1.7–7.7)
Neutrophils Relative %: 89 %
Platelets: 490 10*3/uL — ABNORMAL HIGH (ref 150–400)
RBC: 5.31 MIL/uL (ref 4.22–5.81)
RDW: 17.8 % — ABNORMAL HIGH (ref 11.5–15.5)
WBC: 11.9 10*3/uL — ABNORMAL HIGH (ref 4.0–10.5)
nRBC: 0 % (ref 0.0–0.2)

## 2020-12-03 LAB — MAGNESIUM: Magnesium: 2.1 mg/dL (ref 1.7–2.4)

## 2020-12-03 LAB — BLOOD GAS, ARTERIAL
Acid-base deficit: 9.9 mmol/L — ABNORMAL HIGH (ref 0.0–2.0)
Bicarbonate: 15.3 mmol/L — ABNORMAL LOW (ref 20.0–28.0)
FIO2: 0.36
O2 Saturation: 97.6 %
Patient temperature: 37
pCO2 arterial: 31 mmHg — ABNORMAL LOW (ref 32.0–48.0)
pH, Arterial: 7.3 — ABNORMAL LOW (ref 7.350–7.450)
pO2, Arterial: 107 mmHg (ref 83.0–108.0)

## 2020-12-03 LAB — CBG MONITORING, ED: Glucose-Capillary: 118 mg/dL — ABNORMAL HIGH (ref 70–99)

## 2020-12-03 LAB — RESP PANEL BY RT-PCR (FLU A&B, COVID) ARPGX2
Influenza A by PCR: NEGATIVE
Influenza B by PCR: NEGATIVE
SARS Coronavirus 2 by RT PCR: NEGATIVE

## 2020-12-03 LAB — TROPONIN I (HIGH SENSITIVITY)
Troponin I (High Sensitivity): 23 ng/L — ABNORMAL HIGH (ref <18)
Troponin I (High Sensitivity): 27 ng/L — ABNORMAL HIGH (ref <18)
Troponin I (High Sensitivity): 37 ng/L — ABNORMAL HIGH (ref <18)

## 2020-12-03 LAB — APTT: aPTT: 33 seconds (ref 24–36)

## 2020-12-03 LAB — PROCALCITONIN: Procalcitonin: 0.58 ng/mL

## 2020-12-03 LAB — ETHANOL: Alcohol, Ethyl (B): <10 mg/dL (ref <10)

## 2020-12-03 LAB — BRAIN NATRIURETIC PEPTIDE: B Natriuretic Peptide: 259.5 pg/mL — ABNORMAL HIGH (ref 0.0–100.0)

## 2020-12-03 LAB — PROTIME-INR
INR: 1.1 (ref 0.8–1.2)
Prothrombin Time: 14.5 seconds (ref 11.4–15.2)

## 2020-12-03 LAB — LACTATE DEHYDROGENASE: LDH: 137 U/L (ref 98–192)

## 2020-12-03 LAB — D-DIMER, QUANTITATIVE: D-Dimer, Quant: 3.28 ug/mL-FEU — ABNORMAL HIGH (ref 0.00–0.50)

## 2020-12-03 MED ORDER — IOHEXOL 350 MG/ML SOLN
75.0000 mL | Freq: Once | INTRAVENOUS | Status: AC | PRN
  Administered 2020-12-03: 75 mL via INTRAVENOUS

## 2020-12-03 MED ORDER — SODIUM CHLORIDE 0.9 % IV BOLUS
500.0000 mL | Freq: Once | INTRAVENOUS | Status: AC
  Administered 2020-12-03: 500 mL via INTRAVENOUS

## 2020-12-03 MED ORDER — ASPIRIN 81 MG PO CHEW
324.0000 mg | CHEWABLE_TABLET | Freq: Once | ORAL | Status: AC
  Administered 2020-12-03: 324 mg via ORAL
  Filled 2020-12-03: qty 4

## 2020-12-03 MED ORDER — SODIUM CHLORIDE 0.9 % IV SOLN: INTRAVENOUS | Status: DC

## 2020-12-03 MED ORDER — ACETAMINOPHEN 325 MG RE SUPP
325.0000 mg | Freq: Four times a day (QID) | RECTAL | Status: DC | PRN
  Filled 2020-12-03: qty 1

## 2020-12-03 MED ORDER — ASPIRIN EC 81 MG PO TBEC
81.0000 mg | DELAYED_RELEASE_TABLET | Freq: Every day | ORAL | Status: DC
  Administered 2020-12-04 – 2020-12-07 (×4): 81 mg via ORAL
  Filled 2020-12-03 (×4): qty 1

## 2020-12-03 MED ORDER — ONDANSETRON HCL 4 MG/2ML IJ SOLN: 4.0000 mg | Freq: Four times a day (QID) | INTRAMUSCULAR | Status: DC | PRN

## 2020-12-03 MED ORDER — FOLIC ACID 1 MG PO TABS
1.0000 mg | ORAL_TABLET | Freq: Every day | ORAL | Status: DC
  Administered 2020-12-03 – 2020-12-07 (×5): 1 mg via ORAL
  Filled 2020-12-03 (×6): qty 1

## 2020-12-03 MED ORDER — SODIUM CHLORIDE 0.9 % IV BOLUS (SEPSIS)
1000.0000 mL | Freq: Once | INTRAVENOUS | Status: AC
  Administered 2020-12-03: 1000 mL via INTRAVENOUS

## 2020-12-03 MED ORDER — ONDANSETRON HCL 4 MG PO TABS: 4.0000 mg | ORAL_TABLET | Freq: Four times a day (QID) | ORAL | Status: DC | PRN

## 2020-12-03 MED ORDER — SENNOSIDES-DOCUSATE SODIUM 8.6-50 MG PO TABS: 1.0000 | ORAL_TABLET | Freq: Every evening | ORAL | Status: DC | PRN

## 2020-12-03 MED ORDER — LORAZEPAM 2 MG/ML IJ SOLN: 1.0000 mg | INTRAMUSCULAR | Status: AC | PRN

## 2020-12-03 MED ORDER — OXYCODONE HCL 5 MG PO TABS: 5.0000 mg | ORAL_TABLET | ORAL | Status: DC | PRN

## 2020-12-03 MED ORDER — BISACODYL 5 MG PO TBEC: 5.0000 mg | DELAYED_RELEASE_TABLET | Freq: Every day | ORAL | Status: DC | PRN

## 2020-12-03 MED ORDER — ADULT MULTIVITAMIN W/MINERALS CH
1.0000 | ORAL_TABLET | Freq: Every day | ORAL | Status: DC
  Administered 2020-12-03 – 2020-12-07 (×5): 1 via ORAL
  Filled 2020-12-03 (×5): qty 1

## 2020-12-03 MED ORDER — METOPROLOL TARTRATE 5 MG/5ML IV SOLN: 5.0000 mg | Freq: Four times a day (QID) | INTRAVENOUS | Status: DC | PRN

## 2020-12-03 MED ORDER — ACETAMINOPHEN 325 MG PO TABS
325.0000 mg | ORAL_TABLET | Freq: Four times a day (QID) | ORAL | Status: DC | PRN
  Administered 2020-12-03: 325 mg via ORAL
  Filled 2020-12-03: qty 1

## 2020-12-03 MED ORDER — SODIUM CHLORIDE 0.9 % IV SOLN
500.0000 mg | INTRAVENOUS | Status: DC
  Administered 2020-12-03 – 2020-12-05 (×3): 500 mg via INTRAVENOUS
  Filled 2020-12-03 (×4): qty 500

## 2020-12-03 MED ORDER — LORAZEPAM 1 MG PO TABS
1.0000 mg | ORAL_TABLET | ORAL | Status: AC | PRN
  Administered 2020-12-03: 2 mg via ORAL
  Filled 2020-12-03: qty 2

## 2020-12-03 MED ORDER — ENOXAPARIN SODIUM 40 MG/0.4ML IJ SOSY
40.0000 mg | PREFILLED_SYRINGE | INTRAMUSCULAR | Status: DC
  Administered 2020-12-03 – 2020-12-05 (×3): 40 mg via SUBCUTANEOUS
  Filled 2020-12-03 (×3): qty 0.4

## 2020-12-03 MED ORDER — THIAMINE HCL 100 MG/ML IJ SOLN
100.0000 mg | Freq: Every day | INTRAMUSCULAR | Status: DC
  Administered 2020-12-03 – 2020-12-07 (×5): 100 mg via INTRAVENOUS
  Filled 2020-12-03 (×5): qty 2

## 2020-12-03 MED ORDER — CEFTRIAXONE SODIUM 2 G IJ SOLR
2.0000 g | INTRAMUSCULAR | Status: DC
  Administered 2020-12-03 – 2020-12-05 (×3): 2 g via INTRAVENOUS
  Filled 2020-12-03 (×3): qty 20
  Filled 2020-12-03: qty 2

## 2020-12-03 MED ORDER — MORPHINE SULFATE (PF) 2 MG/ML IV SOLN: 1.0000 mg | INTRAVENOUS | Status: DC | PRN

## 2020-12-03 NOTE — H&P (Signed)
History and Physical    Austin Zamora BOF:[id number] DOB: 07/12/1940 DOA: 12/03/2020  PCP: Alwyn Pea, MD  Cardiologist: Dr. Arnoldo Hooker in 2019-2020 but has not followed up  Patient coming from: Local store via EMS  I have personally briefly reviewed patient's old medical records in [facility].  Chief Complaint: collapsing in a store  HPI: Austin Zamora is a 80 y.o. male with medical history significant for aortic stenosis, chronic systolic CHF EF 20% by 2019 echocardiogram, mitral regurgitation, paroxysmal atrial fibrillation not on anticoagulation due to history of diverticular bleed 2019, thoracic aortic aneurysm, hypertension, malnutrition, alcohol abuse, who was prescribed medications but not currently taking any, who presents to the emergency department on 12/03/2020 with episode of collapsing / slumping over in a store. Onset of collapsing was on the day of admission and duration was brief. He went to a tobacco store to get cigarettes. He was himself at 9 am. His sister provides history stating: He normally shuffles but when he got out of the the truck to go to the store around 10:00 am he was shuffling more than usual. In the store, he started acting funny "like he had no power" and put all of his money out on the counter. Then he just gave out/ collapsed but his sister caught him and he did not hit his head; she does not think he lost consciousness. She put him down on a chair and he seemed "a little bit out of it." No focal weakness, numbness, slurred speech, or facial droop. Patient reports he just tripped and that was all that happened. Associated symptoms: He states he is not short of breath but has had a cough that started 1 week before admission, is productive of yellow to clear sputum. He denies wheezing. Unintentional weight loss but he is not sure how much. Has difficulty walking chronically; has a walker but does not usually use it. No chest pain, palpitations.  Symptoms are alleviated by nothing and exacerbated by nothing.  ED Course: Patient was afebrile with initial peripheral oxygen saturation of 77%; with oxygen improved to 86% and eventually to 95% on 3 L nasal cannula.  Other vitals include pulse 85-110 and briefly to 120, and respiratory rate 17-39.  Labs include creatinine 1.45, BUN 37, GFR 49, bicarb 18, anion gap 17, glucose 115, albumin 3.6, AST 47, total bilirubin 1.6, troponin 23, BNP 259 (previously >4,500 several years ago), WBCs 11.9, platelets 490.  ABG on oxygen revealed pH 7.38, PCO2 31, PO2 107.    History Notes:  Echocardiogram, 05/07/2017, Dr. Adrian Blackwater Study Conclusions  - Left ventricle: The cavity size was moderately dilated. Systolic    function was severely reduced. The estimated ejection fraction    was 20%. Diffuse hypokinesis. The study is not technically    sufficient to allow evaluation of LV diastolic function.  - Aortic valve: There was mild regurgitation. Valve area (VTI):    0.36 cm^2. Valve area (Vmax): 0.41 cm^2. Valve area (Vmean): 0.35    cm^2.  - Mitral valve: Calcified annulus. There was moderate    regurgitation.  - Left atrium: The atrium was mildly dilated.  - Right ventricle: The cavity size was mildly dilated.  - Tricuspid valve: There was moderate regurgitation.  - Pericardium, extracardiac: A trivial pericardial effusion was    identified. There was a left pleural effusion.  Impressions:  - 4 chamber dilatation with severe LV systolic dysfunction and    diffuse hypokinesis and severe Aortic  stenosis and mild AR.    Moderate MR. Large left pleural effusion.   Cardiac catheterization, 09/22/2017, Dr. Arnoldo Hooker: Assessment The patient has had Acute on chronic CCS III symptoms and significant progression of aortic stenosis and insufficiiency   abnormal left ventricular function with ejection fraction of 30% with hypokinesis of all myocardial walls.  There is mild left ventricular  enlargement   Pulmonary capillary wedge pressures with mild elevation.    mild pulmonary hypertension   severe aortic insufficiency with mild aortic stenosis There is severe dilation of the ascending aorta greater than 5 cm with correlation to CT scan of 5.4 cm   normal coronary arteries with no 3 vessel arterial disease and up to 0% stenosis   Plan Aggressive medical management of current symptoms with appropriate use of beta blockers, ACE inhibitors, and diuretics.  For LV systolic dysfunction Consider consultation for aortic valve replacement and likely aortic root repair due to severe dilation Continued medical management of chronic nonvalvular atrial fibrillation   No indication for antiplatelet therapy at this time.   ________________   Review of Systems: As per HPI otherwise all other systems reviewed and are unremarable. CARDIOVASCULAR: No chest pain or palpitations.  GI: No abdominal pain, nausea, vomiting, diarrhea, constipation, or bloody stool.  NEUROLOGICAL: No headache or new focal weakness.  GENITOURINARY: No dysuria or hematuria.    Past Medical History:  Diagnosis Date   Alcohol abuse    Aortic stenosis    nonrheumatic   Chronic systolic CHF (congestive heart failure) (HCC)    Echo 05/07/17: EF 20%, diffuse hypokinesis, moderate mitral regurgitation, severe oartic stenosis   GI bleed 02/04/2018   Procedures on 02/04/18: Colonoscopy: Diverticulosis and blood, no specimens. EGD: Gastritis, Normal esophagus and duodenum, 1 cm hiatal hernia, no specimens.   Hypertension    Mitral regurgitation    Echo 05/07/17: Modearate mitral regurgitation   Odontoid fracture (HCC) 03/07/2020   After a fall. CT: Fracture through the inferior odontoid with mild displacement   Paroxysmal A-fib (HCC)    Protein-calorie malnutrition, severe 05/17/2017   SOBOE (shortness of breath on exertion)    Thoracic aortic aneurysm without rupture 05/27/2017   Ascending aorta dilation 5.4 vm  on 09/22/17 cath and CT.    Past Surgical History:  Procedure Laterality Date   COLONOSCOPY N/A 02/04/2018   Procedure: COLONOSCOPY;  Surgeon: Toledo, Boykin Nearing, MD;  Location: [facility] ENDOSCOPY;  Service: Gastroenterology;  Laterality: N/A;   ESOPHAGOGASTRODUODENOSCOPY N/A 02/04/2018   Procedure: ESOPHAGOGASTRODUODENOSCOPY (EGD);  Surgeon: Toledo, Boykin Nearing, MD;  Location: [facility] ENDOSCOPY;  Service: Gastroenterology;  Laterality: N/A;   RIGHT/LEFT HEART CATH AND CORONARY ANGIOGRAPHY Bilateral 09/22/2017   Procedure: RIGHT/LEFT HEART CATH AND CORONARY ANGIOGRAPHY;  Surgeon: Lamar Blinks, MD;  Location: [facility] INVASIVE CV LAB;  Service: Cardiovascular;  Laterality: Bilateral;    Social History  reports that he has been smoking cigarettes. He has a 60.00 pack-year smoking history. He has never used smokeless tobacco. He reports that he does not currently use alcohol after a past usage of about 45.0 standard drinks per week. He reports that he does not use drugs.  No Known Allergies  Family History  Problem Relation Age of Onset   Breast cancer Mother    Parkinson's disease Father      Home Medications  Patient is not currently taking any medications at home.   Prior to Admission medications   Medication Sig Start Date End Date Taking? Authorizing Provider  feeding supplement, ENSURE ENLIVE, (  ENSURE ENLIVE) LIQD Take 237 mLs by mouth 3 (three) times daily between meals. 05/18/17   Adrian Saran, MD  furosemide (LASIX) 20 MG tablet Take 20 mg by mouth daily.  Patient not taking: Reported on 12/03/2020 04/06/17   [provider]  hydrochlorothiazide (MICROZIDE) 12.5 MG capsule Take 1 capsule by mouth daily. Patient not taking: Reported on 12/03/2020 03/03/20   [provider]  lisinopril (PRINIVIL,ZESTRIL) 20 MG tablet Take 20 mg by mouth daily.  Patient not taking: Reported on 12/03/2020 05/15/17   [provider]  metoprolol succinate (TOPROL-XL) 50 MG 24 hr tablet Take  50 mg by mouth daily.  Patient not taking: Reported on 12/03/2020 04/06/17   [provider]  nicotine (NICODERM CQ - DOSED IN MG/24 HOURS) 14 mg/24hr patch Place 1 patch (14 mg total) onto the skin daily. Patient not taking: No sig reported 05/18/17   Adrian Saran, MD  pantoprazole (PROTONIX) 40 MG tablet Take 1 tablet (40 mg total) by mouth daily. Patient not taking: Reported on 12/03/2020 02/06/18   Adrian Saran, MD  PROAIR HFA 108 [phone] Base) MCG/ACT inhaler Inhale 2 puffs into the lungs every 4 (four) hours as needed for wheezing or shortness of breath.  Patient not taking: Reported on 12/03/2020 05/15/17   [provider]  traMADol (ULTRAM) 50 MG tablet Take 1 tablet (50 mg total) by mouth 3 (three) times daily as needed. Patient not taking: Reported on 12/03/2020 03/07/20 03/07/21  Phineas Semen, MD    Physical Exam: Vitals:   12/03/20 1125 12/03/20 1200 12/03/20 1230 12/03/20 1300  BP:  119/73 (!) 120/98 125/78  Pulse:  (!) 110 96 85  Resp: (!) 39 20 18 20   Temp:      SpO2: 91%  95% 98%    Constitutional:  calm, cachectic, disheveled, ill-appearing. Vitals:   12/03/20 1125 12/03/20 1200 12/03/20 1230 12/03/20 1300  BP:  119/73 (!) 120/98 125/78  Pulse:  (!) 110 96 85  Resp: (!) 39 20 18 20   Temp:      SpO2: 91%  95% 98%   Eyes: Pupils equal and round, lids and conjunctivae without icterus or erythema. ENMT: Mucous membranes are dry. Posterior pharynx clear of any exudate or lesions. Nares patent without discharge or bleeding.  Normocephalic, atraumatic, marked bitemporal wasting.  Poor dentition.   Neck: normal, supple, no masses, trachea midline.  Thyroid nontender, no masses appreciated, no thyromegaly. Respiratory: clear to auscultation bilaterally. Chest wall movements are symmetric. No wheezing, no crackles. No accessory muscle use.  Coarse rhonchi throughout right lung.  Intermittent coughing.  Intermittent tachypnea.  Oxygen by nasal cannula in  place. Cardiovascular: Normal S1, S2. Murmur 3 out of 6 systolic.  Irregular.  Intermittent tachycardia.  No rubs / gallops. Pulses: Radial pulses 2+ bilaterally.  DP and PT pulses decreased at 1+ bilaterally.  Capillary refill 4-5 seconds in the lower extremities bilaterally.  Capillary refill less than 3 seconds in the upper extremities bilaterally. No carotid bruits.  Edema: None bilaterally. GI: soft, concave contour, non-distended, normal active bowel sounds. No hepatosplenomegaly. No rigidity, rebound, or guarding. Non-tender. No masses palpated.  No CVA tenderness bilaterally.  No asterixis. Musculoskeletal: no clubbing / cyanosis. No joint deformity upper and lower extremities. Good ROM, no contractures. Normal muscle tone.  No tenderness or deformity in the back bilaterally.  Atrophy of muscles of the hands bilaterally. Integument: Dry, intact.  Soiled.  Large amounts of black/brown material between toes likely dirt.  Mild erythema of  the feet bilaterally.  Poor skin turgor. Neurologic: CN 2-12 grossly intact. Sensation grossly intact to light touch. DTR 2+ bilaterally.  Babinski: Toes downgoing bilaterally.  Strength 4/5 in all 4.  Intact rapid alternating movements bilaterally.  No pronator drift.  Speech: Patient mumbles (chronic per sister).  Intermittent tremor of the hands. Psychiatric:  Poor judgment and insight. Alert and oriented x 2; stated it was March 05, 2022, then corrected and said it was 2022. Normal mood.  Normal and appropriate affect. Lymphatic: No cervical lymphadenopathy. No supraclavicular lymphadenopathy.   Labs on Admission: I have personally reviewed the following labs and imaging studies.  CBC: Recent Labs  Lab 12/03/20 1122  WBC 11.9*  NEUTROABS 10.6*  HGB 13.9  HCT 45.2  MCV 85.1  PLT 490*    Basic Metabolic Panel: Recent Labs  Lab 12/03/20 1122  NA 133*  K 4.3  CL 98  CO2 18*  GLUCOSE 115*  BUN 37*  CREATININE 1.45*  CALCIUM 9.8     GFR: CrCl cannot be calculated (Unknown ideal weight.).  Liver Function Tests: Recent Labs  Lab 12/03/20 1122  AST 47*  ALT 16  ALKPHOS 83  BILITOT 1.6*  PROT 6.9  ALBUMIN 3.6    Urine analysis: Ordered  Other Previous Labs:  Tumor Markers:  Results for DAIVD, FREDERICKSEN (MRN [id number]) as of 12/03/2020 13:34  Ref. Range 05/17/2017 07:08  AFP, Serum, Tumor Marker Latest Ref Range: 0.0 - 8.3 ng/mL 1.9  CA 19-9 Latest Ref Range: 0 - 35 U/mL 27  CEA Latest Ref Range: 0.0 - 4.7 ng/mL 8.0 (H)    Pleural fluid cytology from ultrasound guided thoracentesis, 05/18/2017: DIAGNOSIS:  A. PLEURAL FLUID, RIGHT; ULTRASOUND-GUIDED THORACENTESIS:  - NEGATIVE FOR MALIGNANCY.  - MACROPHAGES, LYMPHOCYTES, AND A FEW MESOTHELIAL CELLS.     Radiological Exams on Admission: DG Chest Portable 1 View  Result Date: 12/03/2020 CLINICAL DATA:  Near syncope, generalized weakness EXAM: PORTABLE CHEST 1 VIEW COMPARISON:  01/29/2018 chest radiograph. FINDINGS: Stable cardiomediastinal silhouette with top-normal heart size. No pneumothorax. Small right pleural effusion. No left pleural effusion. Stable calcified 1.5 cm structure overlying the right lung base, probably a granuloma. No pulmonary edema. Patchy right lung base opacity. Healed deformity in the lateral right clavicle. IMPRESSION: 1. Small right pleural effusion. 2. Patchy right lung base opacity, which could represent aspiration, pneumonia, scarring or atelectasis. Chest radiograph follow-up advised. Electronically Signed   By: Delbert Phenix M.D.   On: 12/03/2020 11:55    EKG: Independently reviewed. 112 bpm.  Atrial fibrillation.  Ventricular premature complex.  QTc 436.  T wave inversions in leads II, 3, aVF, V3, V4, V5.  Slight ST depression in V3 and V4.    Assessment/Plan  Principal Problem:    Acute respiratory failure with hypoxia (HCC) Patient with tachypnea to 28 and initial peripheral oxygen saturation level of 77%.  Suspect  this is due to pneumonia but could have other cause including cardiac. Plan: Telemetry.  Continuous oxygen support and increase oxygen as needed.  Ordered D-dimer and if positive then obtain CTA of the chest.   Active Problems:    Sepsis due to undetermined organism (HCC) Possible diagnosis.  Criteria: Pulse 110, respiratory rate 28, but these values could be due to other causes.  Appears to have pneumonia on chest x-ray. Plan:  Sepsis order set. Cultures ordered. IV antibiotics: Ceftriaxone 2 g IV daily and azithromycin IV. Monitor for signs of volume depletion; monitor blood pressure carefully.  Gave initial  IVF 30 mL/kg x 1. Follow lactic acid levels.    Bacterial pneumonia Requiring continuous oxygen support.  Type: Likely community acquired pneumonia, but could be aspiration in this patient with alcoholism. Criteria for diagnosis: Chest x-ray suggestive of pneumonia, rhonchi on physical exam. Likely bacterial.  Plan: Sputum culture has been ordered.  Treat with IV ceftriaxone and IV azithromycin.  Monitor oxygen saturation levels.  Continuous oxygen support.    Moderate aortic valve stenosis Concerned that patient's collapse could be a presyncopal episode that could indicate his aortic stenosis is worse.  He has been lost to follow-up and has not been seen for several years. Plan: Consulted cardiologist, Dr. Welton Flakes, who has graciously agreed to consult on the patient.  Ordered echocardiogram.    Paroxysmal atrial fibrillation (HCC) Known atrial fibrillation.  Was taken off of anticoagulation in 2019 due to a GI bleed. Not taking any medications at home. Plan: We will likely restart home beta-blocker, but will await consult by cardiologist.  Hold anticoagulation for now due to history of GI bleed.    Troponin I above reference range Hypoxia could be due to pneumonia or could have a cardiac cause.  He has an abnormal EKG with multiple T wave inversions.  Could have cardiac ischemia.   Does not have chest pain. Plan: Aspirin 324 mg x 1 given, then daily aspirin. Nitroglycerin prn. Measure lipid panel. Telemetry. Troponin ordered. Oxygen support by nasal cannula. Consulted cardiologist.    Protein-calorie malnutrition, severe Likely related to poor oral intake and alcoholism.  Plan: Nutrition consult.    Acute kidney injury (HCC) Likely due to dehydration.  Plan: Check urine labs.  Avoid nephrotoxins.  Gentle IV fluids.    Chronic systolic CHF (congestive heart failure) (HCC) Echocardiogram, 05/07/2017, Dr. Adrian Blackwater Study Conclusions  - Left ventricle: The cavity size was moderately dilated. Systolic    function was severely reduced. The estimated ejection fraction    was 20%. Diffuse hypokinesis. The study is not technically    sufficient to allow evaluation of LV diastolic function.  - Aortic valve: There was mild regurgitation. Valve area (VTI):    0.36 cm^2. Valve area (Vmax): 0.41 cm^2. Valve area (Vmean): 0.35    cm^2.  - Mitral valve: Calcified annulus. There was moderate    regurgitation.  - Left atrium: The atrium was mildly dilated.  - Right ventricle: The cavity size was mildly dilated.  - Tricuspid valve: There was moderate regurgitation.  - Pericardium, extracardiac: A trivial pericardial effusion was    identified. There was a left pleural effusion.  Impressions:  - 4 chamber dilatation with severe LV systolic dysfunction and    diffuse hypokinesis and severe Aortic stenosis and mild AR.    Moderate MR. Large left pleural effusion.  Plan:  No acute CHF on exam or imaging on admission.  Patient is requiring IV fluids on admission and has high risk of developing acute CHF.  Monitor I's/O.  Cardiology consult.  Will likely restart beta-blocker, ACE inhibitor but will await cardiology consult.    Pre-syncope Concerning that this could be due to worsening aortic stenosis, but certainly could have other causes. Plan: Telemetry.  Troponin.  N.p.o.  until nursing bedside swallow evaluation.  Neurochecks.    Elevated liver function tests Likely related to alcohol abuse.  Plan: Follow-up outpatient with primary care.    Thoracic aortic aneurysm without rupture Plan: Will need outpatient follow-up.    Alcohol abuse Plan: Detox protocol.  Start patient on prn Ativan for  breakthrough symptoms.  Give thiamine/folate/MVI.  Nurse to monitor patient closely and check withdrawal symptom scores.   Patient advised to stop drinking but to do so under medical supervision because of DTs risk.  Patient advised to take thiamine and multivitamin daily after discharge.    Cigarette nicotine dependence, uncomplicated Plan: Counseled to quit.     DVT prophylaxis: Lovenox.  Code Status:   Full Code Family Communication:  sister at bedside  Balboa,Carolyn Sister [phone]    Disposition Plan:   Patient is from:  Home  Anticipated DC to:  Home  Anticipated DC date:  12/04/2020  Anticipated DC barriers: Respiratory failure, risks of cardiac deterioration.  Consults called:  Dr. Welton Flakes, cardiologist  Admission status:  Inpatient   Severity of Illness: The appropriate patient status for this patient is INPATIENT. Inpatient status is judged to be reasonable and necessary in order to provide the required intensity of service to ensure the patient's safety. The patient's presenting symptoms, physical exam findings, and initial radiographic and laboratory data in the context of their chronic comorbidities is felt to place them at high risk for further clinical deterioration. Furthermore, it is not anticipated that the patient will be medically stable for discharge from the hospital within 2 midnights of admission. The following factors support the patient status of inpatient.   " The patient's presenting symptoms include presyncope, cough.  Patient minimizes symptoms. " The worrisome physical exam findings include tachypnea, hypoxia with O2 sat 77%,  coarse rhonchi, irregular heartbeat with murmur, cachectic body habitus, decreased perfusion in the lower extremities. " The initial radiographic and laboratory data are worrisome because of pneumonia on chest x-ray, elevated BNP, elevated lactic acid, troponin levels above the reference range, atrial fibrillation with rapid ventricular response, intermittent arrhythmias on telemetry. " The chronic co-morbidities include aortic stenosis, chronic systolic CHF, protein calorie malnutrition, alcoholism, medication noncompliance with patient not taking any medicine at home.   * I certify that at the point of admission it is my clinical judgment that the patient will require inpatient hospital care spanning beyond 2 midnights from the point of admission due to high intensity of service, high risk for further deterioration and high frequency of surveillance required.Marlow Baars MD [facility]  How to contact the [facility] Attending or Consulting provider 7A - 7P or covering provider during after hours 7P -7A, for this patient?   Check the care team in [facility] and look for a) attending/consulting [facility] provider listed and b) the [facility] team listed Log into [email or web address] and use [place]'s universal password to access. If you do not have the password, please contact the hospital operator. Locate the [facility] provider you are looking for under [facility] and page to a number that you can be directly reached. If you still have difficulty reaching the provider, please page the [facility] (Director on Call) for the Hospitalists listed on amion for assistance.  12/03/2020, 2:02 PM

## 2020-12-03 NOTE — Consult Note (Signed)
CODE SEPSIS - PHARMACY COMMUNICATION  **Broad Spectrum Antibiotics should be administered within 1 hour of Sepsis diagnosis**  Time Code Sepsis Called/Page Received: 1404  Antibiotics Ordered: Azithromycin, Rocephin  Time of 1st antibiotic administration: 1437  Additional action taken by pharmacy: none  If necessary, Name of Provider/Nurse Contacted: n/a    Bettey Costa ,PharmD Clinical Pharmacist  12/03/2020  2:39 PM

## 2020-12-03 NOTE — ED Provider Notes (Signed)
[facility] Emergency Department Provider Note   ____________________________________________   Event Date/Time   First MD Initiated Contact with Patient 12/03/20 1112     (approximate)  I have reviewed the triage vital signs and the nursing notes.   HISTORY  Chief Complaint Near Syncope   HPI Austin Zamora is a 80 y.o. male patient was going to get cigarettes that he could smoke.  He apparently got very weak and slumped over in a chair.  He says he is feeling fine now.  He has a deep wet cough.  He reportedly drinks at least a 40 ounce beer a day.  I put him on CIWA.  He denies anything bad going on with him currently.  Specifically he says he is not having any chest pain or shortness of breath now.  He says he feels fine.         Past Medical History:  Diagnosis Date   Acute kidney injury (HCC)    Acute renal failure (ARF) (HCC)    Alcohol abuse    Aortic stenosis    Nonrheumatic aortic valve stenosis    Paroxysmal A-fib (HCC)    Protein calorie malnutrition (HCC)    SOBOE (shortness of breath on exertion)     Patient Active Problem List   Diagnosis Date Noted   Pressure injury of skin 01/30/2018   Acute on chronic systolic CHF (congestive heart failure) (HCC) 01/29/2018   Acute renal failure (ARF) (HCC)    Acute kidney injury (HCC)    Nonrheumatic aortic valve stenosis    Influenza A 05/17/2017   Protein-calorie malnutrition, severe 05/17/2017   Paroxysmal A-fib (HCC) 04/06/2017   SOBOE (shortness of breath on exertion) 04/06/2017   Alcohol abuse 10/16/2014   Aortic stenosis 09/06/2014    Past Surgical History:  Procedure Laterality Date   COLONOSCOPY N/A 02/04/2018   Procedure: COLONOSCOPY;  Surgeon: Toledo, Boykin Nearing, MD;  Location: [facility] ENDOSCOPY;  Service: Gastroenterology;  Laterality: N/A;   ESOPHAGOGASTRODUODENOSCOPY N/A 02/04/2018   Procedure: ESOPHAGOGASTRODUODENOSCOPY (EGD);  Surgeon: Toledo, Boykin Nearing, MD;  Location: [facility]  ENDOSCOPY;  Service: Gastroenterology;  Laterality: N/A;   RIGHT/LEFT HEART CATH AND CORONARY ANGIOGRAPHY Bilateral 09/22/2017   Procedure: RIGHT/LEFT HEART CATH AND CORONARY ANGIOGRAPHY;  Surgeon: Lamar Blinks, MD;  Location: [facility] INVASIVE CV LAB;  Service: Cardiovascular;  Laterality: Bilateral;    Prior to Admission medications   Medication Sig Start Date End Date Taking? Authorizing Provider  feeding supplement, ENSURE ENLIVE, (ENSURE ENLIVE) LIQD Take 237 mLs by mouth 3 (three) times daily between meals. 05/18/17   Adrian Saran, MD  furosemide (LASIX) 20 MG tablet Take 20 mg by mouth daily.  04/06/17   [provider]  lisinopril (PRINIVIL,ZESTRIL) 20 MG tablet Take 20 mg by mouth daily.  05/15/17   [provider]  metoprolol succinate (TOPROL-XL) 50 MG 24 hr tablet Take 50 mg by mouth daily.  04/06/17   [provider]  nicotine (NICODERM CQ - DOSED IN MG/24 HOURS) 14 mg/24hr patch Place 1 patch (14 mg total) onto the skin daily. 05/18/17   Adrian Saran, MD  pantoprazole (PROTONIX) 40 MG tablet Take 1 tablet (40 mg total) by mouth daily. 02/06/18   Adrian Saran, MD  PROAIR HFA 108 (90 Base) MCG/ACT inhaler Inhale 2 puffs into the lungs every 4 (four) hours as needed for wheezing or shortness of breath.  05/15/17   [provider]  traMADol (ULTRAM) 50 MG tablet Take 1 tablet (50  mg total) by mouth 3 (three) times daily as needed. 03/07/20 03/07/21  Phineas Semen, MD    Allergies Patient has no known allergies.  Family History  Problem Relation Age of Onset   Breast cancer Mother    Parkinson's disease Father     Social History Social History   Tobacco Use   Smoking status: Former    Packs/day: 0.50    Years: 60.00    Pack years: 30.00    Types: Cigarettes   Smokeless tobacco: Never   Tobacco comments:    quit briefly in march  Substance Use Topics   Alcohol use: Yes    Alcohol/week: 2.0 standard drinks    Types: 2 Cans of beer per week     Comment: Drinks two 40's daily   Drug use: No    Review of Systems  Constitutional: No fever/chills Eyes: No visual changes. ENT: No sore throat. Cardiovascular: Denies chest pain. Respiratory: Denies shortness of breath. Gastrointestinal: No abdominal pain.  No nausea, no vomiting.  No diarrhea.  No constipation. Genitourinary: Negative for dysuria. Musculoskeletal: Negative for back pain. Skin: Negative for rash. Neurological: Negative for headaches, focal weakness  ____________________________________________   PHYSICAL EXAM:  VITAL SIGNS: ED Triage Vitals [12/03/20 1105]  Enc Vitals Group     BP 120/73     Pulse Rate 93     Resp 17     Temp 98 F (36.7 C)     Temp src      SpO2 (!) 77 %     Weight      Height      Head Circumference      Peak Flow      Pain Score      Pain Loc      Pain Edu?      Excl. in GC?     Constitutional: Alert cachectic male with a deep cough Eyes: Conjunctivae are normal. PER Head: Atraumatic. Nose: No congestion/rhinnorhea. Mouth/Throat: Mucous membranes are moist.  Oropharynx non-erythematous. Neck: No stridor.  Cardiovascular: Normal rate, regular rhythm. Grossly normal heart sounds.  Good peripheral circulation. Respiratory: Normal respiratory effort.  No retractions. Lungs CTAB. Gastrointestinal: Soft and nontender. No distention. No abdominal bruits. No CVA tenderness. Musculoskeletal: No lower extremity tenderness nor edema.  No joint effusions. Neurologic:  Normal speech and language. No gross focal neurologic deficits are appreciated. No gait instability. Skin:  Skin is warm, dry and intact. No rash noted. Psychiatric: Mood and affect are normal. Speech and behavior are normal.  ____________________________________________   LABS (all labs ordered are listed, but only abnormal results are displayed)  Labs Reviewed  COMPREHENSIVE METABOLIC PANEL - Abnormal; Notable for the following components:      Result Value    Sodium 133 (*)    CO2 18 (*)    Glucose, Bld 115 (*)    BUN 37 (*)    Creatinine, Ser 1.45 (*)    AST 47 (*)    Total Bilirubin 1.6 (*)    GFR, Estimated 49 (*)    Anion gap 17 (*)    All other components within normal limits  CBC WITH DIFFERENTIAL/PLATELET - Abnormal; Notable for the following components:   WBC 11.9 (*)    RDW 17.8 (*)    Platelets 490 (*)    Neutro Abs 10.6 (*)    Lymphs Abs 0.4 (*)    All other components within normal limits  BLOOD GAS, ARTERIAL - Abnormal; Notable for the following components:  pH, Arterial 7.30 (*)    pCO2 arterial 31 (*)    Bicarbonate 15.3 (*)    Acid-base deficit 9.9 (*)    All other components within normal limits  BRAIN NATRIURETIC PEPTIDE - Abnormal; Notable for the following components:   B Natriuretic Peptide 259.5 (*)    All other components within normal limits  CBG MONITORING, ED - Abnormal; Notable for the following components:   Glucose-Capillary 118 (*)    All other components within normal limits  TROPONIN I (HIGH SENSITIVITY) - Abnormal; Notable for the following components:   Troponin I (High Sensitivity) 23 (*)    All other components within normal limits  RESP PANEL BY RT-PCR (FLU A&B, COVID) ARPGX2  URINALYSIS, COMPLETE (UACMP) WITH MICROSCOPIC  PROCALCITONIN  TROPONIN I (HIGH SENSITIVITY)   ____________________________________________  EKG  EKG read interpreted by me shows A. fib at a rate of 112 normal axis deeply inverted T waves in most leads.  This is similar to previous EKGs but slightly worse. ____________________________________________  RADIOLOGY Jill Poling, personally viewed and evaluated these images (plain radiographs) as part of my medical decision making, as well as reviewing the written report by the radiologist.  ED MD interpretation: Chest x-ray reviewed by me looks like a right middle and/or lower lobe infiltrate.[id number] Possibly a small left-sided pneumothorax although difficult to  tell.  Official radiology report(s): DG Chest Portable 1 View  Result Date: 12/03/2020 CLINICAL DATA:  Near syncope, generalized weakness EXAM: PORTABLE CHEST 1 VIEW COMPARISON:  01/29/2018 chest radiograph. FINDINGS: Stable cardiomediastinal silhouette with top-normal heart size. No pneumothorax. Small right pleural effusion. No left pleural effusion. Stable calcified 1.5 cm structure overlying the right lung base, probably a granuloma. No pulmonary edema. Patchy right lung base opacity. Healed deformity in the lateral right clavicle. IMPRESSION: 1. Small right pleural effusion. 2. Patchy right lung base opacity, which could represent aspiration, pneumonia, scarring or atelectasis. Chest radiograph follow-up advised. Electronically Signed   By: Delbert Phenix M.D.   On: 12/03/2020 11:55    ____________________________________________   PROCEDURES  Procedure(s) performed (including Critical Care):  Procedures   ____________________________________________   INITIAL IMPRESSION / ASSESSMENT AND PLAN / ED COURSE ----------------------------------------- 1:07 PM on 12/03/2020 -----------------------------------------  Patient now requires oxygen.  Initial O2 sats were in the 70s.  His chest x-ray shows what may be a pneumonia in the right middle or lower lobe.  He is cachectic.  He has what appears to be a metabolic acidosis for reasons unknown.  Additionally he had near syncope with a known diagnosis of aortic stenosis.  It may be coming critical.  He should have an echo to evaluate that.  I am going to asked the hospitalist to get him in the hospital to evaluate these problems.  I have ordered a procalcitonin to help me determine if he needs antibiotics.  His white count is only minimally elevated but he has a shift to the left.              ____________________________________________   FINAL CLINICAL IMPRESSION(S) / ED DIAGNOSES  Final diagnoses:  Near syncope  Hypoxia   Abnormal chest x-ray  Aortic valve stenosis, etiology of cardiac valve disease unspecified     ED Discharge Orders     None        Note:  This document was prepared using Dragon voice recognition software and may include unintentional dictation errors.   Arnaldo Natal, MD 12/03/20 1308

## 2020-12-03 NOTE — ED Notes (Signed)
Pt pulse ox changed to nasal pulse ox

## 2020-12-03 NOTE — ED Notes (Signed)
Lab at bedside to collect blood specimens.

## 2020-12-03 NOTE — ED Notes (Signed)
MD aware of critical lactic of 2.0

## 2020-12-03 NOTE — Sepsis Progress Note (Signed)
Code sepsis protocol being monitored by eLink. 

## 2020-12-03 NOTE — ED Triage Notes (Signed)
Pt comes into the ED via EMS from a store, states he has sudden onset generalized weakness and slid down into a chair [id number] /56 [id number]

## 2020-12-04 ENCOUNTER — Inpatient Hospital Stay: Admit: 2020-12-04 | Payer: Medicare Other

## 2020-12-04 ENCOUNTER — Encounter: Payer: Self-pay | Admitting: Internal Medicine

## 2020-12-04 ENCOUNTER — Inpatient Hospital Stay
Admit: 2020-12-04 | Discharge: 2020-12-04 | Disposition: A | Discharge status: Home / Self Care | Payer: Medicare Other | Attending: Internal Medicine | Admitting: Internal Medicine

## 2020-12-04 DIAGNOSIS — N179 Acute kidney failure, unspecified: Secondary | ICD-10-CM | POA: Diagnosis not present

## 2020-12-04 DIAGNOSIS — I5022 Chronic systolic (congestive) heart failure: Secondary | ICD-10-CM | POA: Diagnosis not present

## 2020-12-04 DIAGNOSIS — J9601 Acute respiratory failure with hypoxia: Secondary | ICD-10-CM | POA: Diagnosis not present

## 2020-12-04 DIAGNOSIS — J159 Unspecified bacterial pneumonia: Secondary | ICD-10-CM

## 2020-12-04 LAB — URINALYSIS, COMPLETE (UACMP) WITH MICROSCOPIC
Bacteria, UA: NONE SEEN
Bilirubin Urine: NEGATIVE
Glucose, UA: NEGATIVE mg/dL
Hgb urine dipstick: NEGATIVE
Ketones, ur: NEGATIVE mg/dL
Leukocytes,Ua: NEGATIVE
Nitrite: NEGATIVE
Protein, ur: 30 mg/dL — AB
Specific Gravity, Urine: 1.034 — ABNORMAL HIGH (ref 1.005–1.030)
Squamous Epithelial / HPF: NONE SEEN (ref 0–5)
pH: 5 (ref 5.0–8.0)

## 2020-12-04 LAB — CREATININE, URINE, RANDOM: Creatinine, Urine: 164 mg/dL

## 2020-12-04 LAB — URINE DRUG SCREEN, QUALITATIVE (ARMC ONLY)
Amphetamines, Ur Screen: NOT DETECTED
Barbiturates, Ur Screen: NOT DETECTED
Benzodiazepine, Ur Scrn: NOT DETECTED
Cannabinoid 50 Ng, Ur ~~LOC~~: NOT DETECTED
Cocaine Metabolite,Ur ~~LOC~~: NOT DETECTED
MDMA (Ecstasy)Ur Screen: NOT DETECTED
Methadone Scn, Ur: NOT DETECTED
Opiate, Ur Screen: NOT DETECTED
Phencyclidine (PCP) Ur S: NOT DETECTED
Tricyclic, Ur Screen: NOT DETECTED

## 2020-12-04 LAB — LIPID PANEL
Cholesterol: 87 mg/dL (ref 0–200)
HDL: 48 mg/dL (ref >40)
LDL Cholesterol: 30 mg/dL (ref 0–99)
Total CHOL/HDL Ratio: 1.8 RATIO
Triglycerides: 43 mg/dL (ref <150)
VLDL: 9 mg/dL (ref 0–40)

## 2020-12-04 LAB — CBC
HCT: 35.3 % — ABNORMAL LOW (ref 39.0–52.0)
Hemoglobin: 11.2 g/dL — ABNORMAL LOW (ref 13.0–17.0)
MCH: 26.9 pg (ref 26.0–34.0)
MCHC: 31.7 g/dL (ref 30.0–36.0)
MCV: 84.7 fL (ref 80.0–100.0)
Platelets: 325 10*3/uL (ref 150–400)
RBC: 4.17 MIL/uL — ABNORMAL LOW (ref 4.22–5.81)
RDW: 17.2 % — ABNORMAL HIGH (ref 11.5–15.5)
WBC: 7.9 10*3/uL (ref 4.0–10.5)
nRBC: 0 % (ref 0.0–0.2)

## 2020-12-04 LAB — BASIC METABOLIC PANEL
Anion gap: 7 (ref 5–15)
BUN: 39 mg/dL — ABNORMAL HIGH (ref 8–23)
CO2: 21 mmol/L — ABNORMAL LOW (ref 22–32)
Calcium: 8.3 mg/dL — ABNORMAL LOW (ref 8.9–10.3)
Chloride: 110 mmol/L (ref 98–111)
Creatinine, Ser: 1.27 mg/dL — ABNORMAL HIGH (ref 0.61–1.24)
GFR, Estimated: 57 mL/min — ABNORMAL LOW (ref >60)
Glucose, Bld: 92 mg/dL (ref 70–99)
Potassium: 4.2 mmol/L (ref 3.5–5.1)
Sodium: 138 mmol/L (ref 135–145)

## 2020-12-04 LAB — SODIUM, URINE, RANDOM: Sodium, Ur: 15 mmol/L

## 2020-12-04 LAB — PROTEIN / CREATININE RATIO, URINE
Creatinine, Urine: 166 mg/dL
Protein Creatinine Ratio: 0.2 mg/mg{Cre} — ABNORMAL HIGH (ref 0.00–0.15)
Total Protein, Urine: 33 mg/dL

## 2020-12-04 LAB — URINE CULTURE
Culture: NO GROWTH
Special Requests: NORMAL

## 2020-12-04 LAB — TROPONIN I (HIGH SENSITIVITY): Troponin I (High Sensitivity): 40 ng/L — ABNORMAL HIGH (ref <18)

## 2020-12-04 LAB — LACTIC ACID, PLASMA: Lactic Acid, Venous: 1.1 mmol/L (ref 0.5–1.9)

## 2020-12-04 NOTE — ED Notes (Signed)
Pt sleeping, no distress noted. Occasional sleep apnea. Pt on 5 LNC, sats 100%. Pt placed on RA. See flowsheet.

## 2020-12-04 NOTE — Assessment & Plan Note (Signed)
Monitor on tele. Planned cath this Thu/fri by cardio. Has moderate aortic valve stenosis

## 2020-12-04 NOTE — Assessment & Plan Note (Signed)
Seen on CXR. Continue Rocephin + zithromax

## 2020-12-04 NOTE — Assessment & Plan Note (Signed)
Poor po intake. Nutritionist c/s

## 2020-12-04 NOTE — Assessment & Plan Note (Addendum)
Well compensated at this time. EF 20% on previous echo. Pending echo this admission.

## 2020-12-04 NOTE — Progress Notes (Signed)
Cross coverage  Sinus pause of 2.46s. patient asymptomatic  Metoprolol held. Will continue to monitor.

## 2020-12-04 NOTE — ED Notes (Signed)
Admitting provider Para March updated on labs and of irregular heart rate rhythm. No new orders at this time

## 2020-12-04 NOTE — Assessment & Plan Note (Signed)
from pneumonia. Continue Abx

## 2020-12-04 NOTE — Consult Note (Signed)
Austin Zamora is a 80 y.o. male  [id number]  Primary Cardiologist: Adrian Blackwater, MD Reason for Consultation: near syncope  HPI: Austin Zamora is a 80 y.o. male with medical history significant for aortic stenosis, chronic systolic CHF EF 20% by 2019 echocardiogram, mitral regurgitation, paroxysmal atrial fibrillation not on anticoagulation due to history of diverticular bleed 2019, thoracic aortic aneurysm, hypertension, malnutrition, alcohol abuse, who was prescribed medications but not currently taking any, who presents to the emergency department on 12/03/2020 with episode of collapsing / slumping over in a store. Onset of collapsing was on the day of admission and duration was brief. He went to a tobacco store to get cigarettes. He was himself at 9 am. His sister provides history stating: He normally shuffles but when he got out of the the truck to go to the store around 10:00 am he was shuffling more than usual. In the store, he started acting funny "like he had no power" and put all of his money out on the counter. Then he just gave out/ collapsed but his sister caught him and he did not hit his head; she does not think he lost consciousness. She put him down on a chair and he seemed "a little bit out of it." No focal weakness, numbness, slurred speech, or facial droop. Patient reports he just tripped and that was all that happened. Associated symptoms: He states he is not short of breath but has had a cough that started 1 week before admission, is productive of yellow to clear sputum. He denies wheezing. Unintentional weight loss but he is not sure how much. Has difficulty walking chronically; has a walker but does not usually use it. No chest pain, palpitations. Symptoms are alleviated by nothing and exacerbated by nothing.   Review of Systems: Patient laying in comfortably in bed. Denies chest pain, dizziness. Denies shortness of breath, on 3L O2 nasal cannula.    Past Medical History:   Diagnosis Date   Alcohol abuse    Aortic stenosis    nonrheumatic   Chronic systolic CHF (congestive heart failure) (HCC)    Echo 05/07/17: EF 20%, diffuse hypokinesis, moderate mitral regurgitation, severe oartic stenosis   GI bleed 02/04/2018   Procedures on 02/04/18: Colonoscopy: Diverticulosis and blood, no specimens. EGD: Gastritis, Normal esophagus and duodenum, 1 cm hiatal hernia, no specimens.   Hypertension    Mitral regurgitation    Echo 05/07/17: [place] mitral regurgitation   Odontoid fracture (HCC) 03/07/2020   After a fall. CT: Fracture through the inferior odontoid with mild displacement   Paroxysmal A-fib (HCC)    Protein-calorie malnutrition, severe 05/17/2017   SOBOE (shortness of breath on exertion)    Thoracic aortic aneurysm without rupture 05/27/2017   Ascending aorta dilation 5.4 vm on 09/22/17 cath and CT.    (Not in a hospital admission)     aspirin EC  81 mg Oral Daily   enoxaparin (LOVENOX) injection  40 mg Subcutaneous Q24H   folic acid  1 mg Oral Daily   multivitamin with minerals  1 tablet Oral Daily   thiamine injection  100 mg Intravenous Daily    Infusions:  sodium chloride 75 mL/hr at 12/03/20 1949   azithromycin Stopped (12/03/20 1620)   cefTRIAXone (ROCEPHIN)  IV Stopped (12/03/20 1504)    No Known Allergies  Social History   Socioeconomic History   Marital status: Divorced    Spouse name: Not on file   Number of children: Not on file  Years of education: Not on file   Highest education level: Not on file  Occupational History   Not on file  Tobacco Use   Smoking status: Every Day    Packs/day: 1.00    Years: 60.00    Pack years: 60.00    Types: Cigarettes   Smokeless tobacco: Never  Substance and Sexual Activity   Alcohol use: Not Currently    Alcohol/week: 45.0 standard drinks    Types: 45 Cans of beer per week    Comment: Previously drank two 40s daily. Reports drinking 24-40 oz of beer daily as 12/2020.   Drug use: No    Sexual activity: Not on file  Other Topics Concern   Not on file  Social History Narrative   Not on file   Social Determinants of Health   Financial Resource Strain: Not on file  Food Insecurity: Not on file  Transportation Needs: Not on file  Physical Activity: Not on file  Stress: Not on file  Social Connections: Not on file  Intimate Partner Violence: Not on file    Family History  Problem Relation Age of Onset   Breast cancer Mother    Parkinson's disease Father     PHYSICAL EXAM: Vitals:   12/04/20 0800 12/04/20 0830  BP: (!) 140/108 (!) 149/74  Pulse: (!) 49   Resp: (!) 28 15  Temp:    SpO2: 100%     No intake or output data in the 24 hours ending 12/04/20 0844  General:  Well appearing. No respiratory difficulty HEENT: normal Neck: supple. no JVD. Carotids 2+ bilat; no bruits. No lymphadenopathy or thryomegaly appreciated. Cor: PMI nondisplaced. Regular rate & rhythm. No rubs, gallops or murmurs. Lungs: clear Abdomen: soft, nontender, nondistended. No hepatosplenomegaly. No bruits or masses. Good bowel sounds. Extremities: no cyanosis, clubbing, rash, edema Neuro: alert & oriented x 3, cranial nerves grossly intact. moves all 4 extremities w/o difficulty. Affect pleasant.  ECG: atrial fibrillation, HR 92 bpm  Results for orders placed or performed during the hospital encounter of 12/03/20 (from the past 24 hour(s))  Comprehensive metabolic panel     Status: Abnormal   Collection Time: 12/03/20 11:22 AM  Result Value Ref Range   Sodium 133 (L) 135 - 145 mmol/L   Potassium 4.3 3.5 - 5.1 mmol/L   Chloride 98 98 - 111 mmol/L   CO2 18 (L) 22 - 32 mmol/L   Glucose, Bld 115 (H) 70 - 99 mg/dL   BUN 37 (H) 8 - 23 mg/dL   Creatinine, Ser 1.91 (H) 0.61 - 1.24 mg/dL   Calcium 9.8 8.9 - 47.8 mg/dL   Total Protein 6.9 6.5 - 8.1 g/dL   Albumin 3.6 3.5 - 5.0 g/dL   AST 47 (H) 15 - 41 U/L   ALT 16 0 - 44 U/L   Alkaline Phosphatase 83 38 - 126 U/L   Total  Bilirubin 1.6 (H) 0.3 - 1.2 mg/dL   GFR, Estimated 49 (L) >60 mL/min   Anion gap 17 (H) 5 - 15  Troponin I (High Sensitivity)     Status: Abnormal   Collection Time: 12/03/20 11:22 AM  Result Value Ref Range   Troponin I (High Sensitivity) 23 (H) <18 ng/L  CBC with Differential     Status: Abnormal   Collection Time: 12/03/20 11:22 AM  Result Value Ref Range   WBC 11.9 (H) 4.0 - 10.5 K/uL   RBC 5.31 4.22 - 5.81 MIL/uL   Hemoglobin 13.9 13.0 -  17.0 g/dL   HCT 09.7 35.3 - 29.9 %   MCV 85.1 80.0 - 100.0 fL   MCH 26.2 26.0 - 34.0 pg   MCHC 30.8 30.0 - 36.0 g/dL   RDW 24.2 (H) 68.3 - 41.9 %   Platelets 490 (H) 150 - 400 K/uL   nRBC 0.0 0.0 - 0.2 %   Neutrophils Relative % 89 %   Neutro Abs 10.6 (H) 1.7 - 7.7 K/uL   Lymphocytes Relative 4 %   Lymphs Abs 0.4 (L) 0.7 - 4.0 K/uL   Monocytes Relative 7 %   Monocytes Absolute 0.8 0.1 - 1.0 K/uL   Eosinophils Relative 0 %   Eosinophils Absolute 0.0 0.0 - 0.5 K/uL   Basophils Relative 0 %   Basophils Absolute 0.0 0.0 - 0.1 K/uL   Immature Granulocytes 0 %   Abs Immature Granulocytes 0.04 0.00 - 0.07 K/uL  Brain natriuretic peptide     Status: Abnormal   Collection Time: 12/03/20 11:22 AM  Result Value Ref Range   B Natriuretic Peptide 259.5 (H) 0.0 - 100.0 pg/mL  Blood gas, arterial     Status: Abnormal   Collection Time: 12/03/20 11:24 AM  Result Value Ref Range   FIO2 0.36    Delivery systems NASAL CANNULA    pH, Arterial 7.30 (L) 7.350 - 7.450   pCO2 arterial 31 (L) 32.0 - 48.0 mmHg   pO2, Arterial 107 83.0 - 108.0 mmHg   Bicarbonate 15.3 (L) 20.0 - 28.0 mmol/L   Acid-base deficit 9.9 (H) 0.0 - 2.0 mmol/L   O2 Saturation 97.6 %   Patient temperature 37.0    Collection site RIGHT RADIAL    Sample type ARTERIAL DRAW    Allens test (pass/fail) PASS PASS  Resp Panel by RT-PCR (Flu A&B, Covid) Nasopharyngeal Swab     Status: None   Collection Time: 12/03/20 11:33 AM   Specimen: Nasopharyngeal Swab; Nasopharyngeal(NP) swabs in  vial transport medium  Result Value Ref Range   SARS Coronavirus 2 by RT PCR NEGATIVE NEGATIVE   Influenza A by PCR NEGATIVE NEGATIVE   Influenza B by PCR NEGATIVE NEGATIVE  CBG monitoring, ED     Status: Abnormal   Collection Time: 12/03/20 11:41 AM  Result Value Ref Range   Glucose-Capillary 118 (H) 70 - 99 mg/dL  Troponin I (High Sensitivity)     Status: Abnormal   Collection Time: 12/03/20  2:18 PM  Result Value Ref Range   Troponin I (High Sensitivity) 27 (H) <18 ng/L  Procalcitonin - Baseline     Status: None   Collection Time: 12/03/20  2:18 PM  Result Value Ref Range   Procalcitonin 0.58 ng/mL  Magnesium     Status: None   Collection Time: 12/03/20  2:18 PM  Result Value Ref Range   Magnesium 2.1 1.7 - 2.4 mg/dL  Ethanol     Status: None   Collection Time: 12/03/20  2:18 PM  Result Value Ref Range   Alcohol, Ethyl (B) <10 <10 mg/dL  Lactic acid, plasma     Status: Abnormal   Collection Time: 12/03/20  2:18 PM  Result Value Ref Range   Lactic Acid, Venous 2.0 (HH) 0.5 - 1.9 mmol/L  Protime-INR     Status: None   Collection Time: 12/03/20  2:18 PM  Result Value Ref Range   Prothrombin Time 14.5 11.4 - 15.2 seconds   INR 1.1 0.8 - 1.2  APTT     Status: None   Collection  Time: 12/03/20  2:18 PM  Result Value Ref Range   aPTT 33 24 - 36 seconds  Lactate dehydrogenase     Status: None   Collection Time: 12/03/20  2:18 PM  Result Value Ref Range   LDH 137 98 - 192 U/L  D-dimer, quantitative     Status: Abnormal   Collection Time: 12/03/20  2:18 PM  Result Value Ref Range   D-Dimer, Quant 3.28 (H) 0.00 - 0.50 ug/mL-FEU  Culture, blood (x 2)     Status: None (Preliminary result)   Collection Time: 12/03/20  2:26 PM   Specimen: BLOOD  Result Value Ref Range   Specimen Description BLOOD LEFT ANTECUBITAL    Special Requests      BOTTLES DRAWN AEROBIC AND ANAEROBIC Blood Culture adequate volume   Culture      NO GROWTH < 24 HOURS Performed at [facility],  [address], [place], [place] [address]    Report Status PENDING   Culture, blood (x 2)     Status: None (Preliminary result)   Collection Time: 12/03/20  2:27 PM   Specimen: BLOOD  Result Value Ref Range   Specimen Description BLOOD RIGHT ANTECUBITAL    Special Requests      BOTTLES DRAWN AEROBIC AND ANAEROBIC Blood Culture adequate volume   Culture      NO GROWTH < 24 HOURS Performed at [facility], [address] [place], [place] [address]    Report Status PENDING   Lactic acid, plasma     Status: Abnormal   Collection Time: 12/03/20  5:20 PM  Result Value Ref Range   Lactic Acid, Venous 2.0 (HH) 0.5 - 1.9 mmol/L  Lactic acid, plasma     Status: None   Collection Time: 12/03/20  9:05 PM  Result Value Ref Range   Lactic Acid, Venous 1.2 0.5 - 1.9 mmol/L  Troponin I (High Sensitivity)     Status: Abnormal   Collection Time: 12/03/20  9:05 PM  Result Value Ref Range   Troponin I (High Sensitivity) 37 (H) <18 ng/L  Sodium, urine, random     Status: None   Collection Time: 12/04/20  1:05 AM  Result Value Ref Range   Sodium, Ur 15 mmol/L  Creatinine, urine, random     Status: None   Collection Time: 12/04/20  1:05 AM  Result Value Ref Range   Creatinine, Urine 164 mg/dL  Protein / creatinine ratio, urine     Status: Abnormal   Collection Time: 12/04/20  1:05 AM  Result Value Ref Range   Creatinine, Urine 166 mg/dL   Total Protein, Urine 33 mg/dL   Protein Creatinine Ratio 0.20 (H) 0.00 - 0.15 mg/mg[Cre]  Troponin I (High Sensitivity)     Status: Abnormal   Collection Time: 12/04/20  1:05 AM  Result Value Ref Range   Troponin I (High Sensitivity) 40 (H) <18 ng/L  Lactic acid, plasma     Status: None   Collection Time: 12/04/20  1:05 AM  Result Value Ref Range   Lactic Acid, Venous 1.1 0.5 - 1.9 mmol/L  Urinalysis, Complete w Microscopic Urine, Catheterized     Status: Abnormal   Collection Time: 12/04/20  1:06 AM  Result Value Ref Range   Color, Urine  [place] (A) YELLOW   APPearance CLEAR (A) CLEAR   Specific Gravity, Urine 1.034 (H) 1.005 - 1.030   pH 5.0 5.0 - 8.0   Glucose, UA NEGATIVE NEGATIVE mg/dL   Hgb urine  dipstick NEGATIVE NEGATIVE   Bilirubin Urine NEGATIVE NEGATIVE   Ketones, ur NEGATIVE NEGATIVE mg/dL   Protein, ur 30 (A) NEGATIVE mg/dL   Nitrite NEGATIVE NEGATIVE   Leukocytes,Ua NEGATIVE NEGATIVE   RBC / HPF 0-5 0 - 5 RBC/hpf   WBC, UA 0-5 0 - 5 WBC/hpf   Bacteria, UA NONE SEEN NONE SEEN   Squamous Epithelial / LPF NONE SEEN 0 - 5   Mucus PRESENT    Hyaline Casts, UA PRESENT   Urine Drug Screen, Qualitative ([facility] only)     Status: None   Collection Time: 12/04/20  1:06 AM  Result Value Ref Range   Tricyclic, Ur Screen NONE DETECTED NONE DETECTED   Amphetamines, Ur Screen NONE DETECTED NONE DETECTED   MDMA (Ecstasy)Ur Screen NONE DETECTED NONE DETECTED   Cocaine Metabolite,Ur [place] NONE DETECTED NONE DETECTED   Opiate, Ur Screen NONE DETECTED NONE DETECTED   Phencyclidine (PCP) Ur S NONE DETECTED NONE DETECTED   Cannabinoid 50 Ng, Ur [place] NONE DETECTED NONE DETECTED   Barbiturates, Ur Screen NONE DETECTED NONE DETECTED   Benzodiazepine, Ur Scrn NONE DETECTED NONE DETECTED   Methadone Scn, Ur NONE DETECTED NONE DETECTED  Basic metabolic panel     Status: Abnormal   Collection Time: 12/04/20  3:29 AM  Result Value Ref Range   Sodium 138 135 - 145 mmol/L   Potassium 4.2 3.5 - 5.1 mmol/L   Chloride 110 98 - 111 mmol/L   CO2 21 (L) 22 - 32 mmol/L   Glucose, Bld 92 70 - 99 mg/dL   BUN 39 (H) 8 - 23 mg/dL   Creatinine, Ser 1.93 (H) 0.61 - 1.24 mg/dL   Calcium 8.3 (L) 8.9 - 10.3 mg/dL   GFR, Estimated 57 (L) >60 mL/min   Anion gap 7 5 - 15  CBC     Status: Abnormal   Collection Time: 12/04/20  3:29 AM  Result Value Ref Range   WBC 7.9 4.0 - 10.5 K/uL   RBC 4.17 (L) 4.22 - 5.81 MIL/uL   Hemoglobin 11.2 (L) 13.0 - 17.0 g/dL   HCT 79.0 (L) 24.0 - 97.3 %   MCV 84.7 80.0 - 100.0 fL   MCH 26.9 26.0 - 34.0 pg   MCHC 31.7  30.0 - 36.0 g/dL   RDW 53.2 (H) 99.2 - 42.6 %   Platelets 325 150 - 400 K/uL   nRBC 0.0 0.0 - 0.2 %  Lipid panel     Status: None   Collection Time: 12/04/20  3:29 AM  Result Value Ref Range   Cholesterol 87 0 - 200 mg/dL   Triglycerides 43 <834 mg/dL   HDL 48 >19 mg/dL   Total CHOL/HDL Ratio 1.8 RATIO   VLDL 9 0 - 40 mg/dL   LDL Cholesterol 30 0 - 99 mg/dL   CT Angio Chest Pulmonary Embolism (PE) W or WO Contrast  Result Date: 12/03/2020 CLINICAL DATA:  Hypoxia.  Elevated D-dimer level.  Weight loss. EXAM: CT ANGIOGRAPHY CHEST WITH CONTRAST TECHNIQUE: Multidetector CT imaging of the chest was performed using the standard protocol during bolus administration of intravenous contrast. Multiplanar CT image reconstructions and MIPs were obtained to evaluate the vascular anatomy. CONTRAST:  11mL OMNIPAQUE IOHEXOL 350 MG/ML SOLN COMPARISON:  Multiple exams, including 05/17/2017 FINDINGS: Despite efforts by the technologist and patient, motion artifact is present on today's exam and could not be eliminated. This reduces exam sensitivity and specificity. Cardiovascular: No filling defect is identified in the pulmonary arterial tree  to suggest pulmonary embolus. Ascending thoracic aortic aneurysm 5.4 cm in diameter on image 248 series 5, previously the same on 05/17/2017. Contrast medium was timed for pulmonary arterial opacification and the systemic vasculature is poorly opacified. Coronary, aortic arch, and branch vessel atherosclerotic vascular disease. Aortic and mitral valve calcifications noted. Moderate cardiomegaly in particular with right atrial enlargement. Mediastinum/Nodes: Borderline dilated gas-filled midthoracic esophagus. Lungs/Pleura: Centrilobular emphysema. Mild biapical pleuroparenchymal scarring. Plugging with complete occlusion of the right lower lobe pulmonary artery with scattered nodularity and peripheral atelectasis in the right lower lobe distal to the airway plugging. There is also  some airway thickening and bronchiectasis posteriorly in the right upper lobe. Small right pleural effusion observed with dense pleural calcification on the right side similar to the prior exam. Upper Abdomen: Substantial atherosclerosis of visualized upper abdominal vessels. This includes the abdominal aorta. Musculoskeletal: Degenerative glenohumeral arthropathy bilaterally. Review of the MIP images confirms the above findings. IMPRESSION: 1. No filling defect is identified in the pulmonary arterial tree to suggest pulmonary embolus. 2. Ascending thoracic aortic aneurysm 5.4 cm in diameter, not appreciably changed from 05/17/2017. Ascending thoracic aortic aneurysm. Recommend semi-annual imaging followup by CTA or MRA and referral to cardiothoracic surgery if not already obtained. This recommendation follows 2010 ACCF/AHA/AATS/ACR/ASA/SCA/SCAI/SIR/STS/SVM Guidelines for the Diagnosis and Management of Patients With Thoracic Aortic Disease. Circulation. 2010; 121: E332-R518. Aortic aneurysm NOS (ICD10-I71.9) 3. Aortic Atherosclerosis (ICD10-I70.0). Coronary atherosclerosis with aortic and mitral valve calcifications along with cardiomegaly particularly involving the right atrium. 4.  Emphysema (ICD10-J43.9). 5. Complete plugging of the right lower lobe bronchus, with nodularity in the right lower lobe which is likely inflammatory/infectious, as well as some airspace opacity in the right lower lobe which may reflect pneumonia or atelectasis. 6. Small right pleural effusion, containing a densely 2.1 cm calcified structure similar to the prior exam. 7. Mild bronchiectasis and airway thickening posteriorly in the right upper lobe. Electronically Signed   By: Gaylyn Rong M.D.   On: 12/03/2020 17:59   DG Chest Portable 1 View  Result Date: 12/03/2020 CLINICAL DATA:  Near syncope, generalized weakness EXAM: PORTABLE CHEST 1 VIEW COMPARISON:  01/29/2018 chest radiograph. FINDINGS: Stable cardiomediastinal  silhouette with top-normal heart size. No pneumothorax. Small right pleural effusion. No left pleural effusion. Stable calcified 1.5 cm structure overlying the right lung base, probably a granuloma. No pulmonary edema. Patchy right lung base opacity. Healed deformity in the lateral right clavicle. IMPRESSION: 1. Small right pleural effusion. 2. Patchy right lung base opacity, which could represent aspiration, pneumonia, scarring or atelectasis. Chest radiograph follow-up advised. Electronically Signed   By: Delbert Phenix M.D.   On: 12/03/2020 11:55     ASSESSMENT AND PLAN: Echo today. Anticipate cardiac catheterization Thursday or Friday, delayed due to current pneumonia. Continue holding beta blocker for now.  Museum/gallery conservator FNP-C

## 2020-12-04 NOTE — Plan of Care (Signed)
  Problem: Education: Goal: Knowledge of General Education information will improve Description: Including pain rating scale, medication(s)/side effects and non-pharmacologic comfort measures Outcome: Progressing   Problem: Clinical Measurements: Goal: Cardiovascular complication will be avoided Outcome: Progressing   Problem: Safety: Goal: Ability to remain free from injury will improve Outcome: Progressing   Problem: Clinical Measurements: Goal: Ability to maintain a body temperature in the normal range will improve Outcome: Progressing   Problem: Respiratory: Goal: Ability to maintain a clear airway will improve Outcome: Progressing

## 2020-12-04 NOTE — Assessment & Plan Note (Signed)
Initially required 3 liter O2 in ED but now weaned off to RA. Likely due to pneumonia

## 2020-12-04 NOTE — Assessment & Plan Note (Signed)
Likely demand ischemia. Cath planned this thu/fri

## 2020-12-04 NOTE — Progress Notes (Addendum)
CCMD called and reported that pt just have 2.46 paused. Pt was asymtomatic on assessment. VSS. MD Para March made aware. Will continue to monitor.  Update 2324: MD Para March discontinue PRN IV metropolol. Will continue to monitor.

## 2020-12-04 NOTE — Progress Notes (Signed)
  Progress Note    Austin Zamora   XNA:[id number]  DOB: 03-07-1940  DOA: 12/03/2020     1 Date of Service: 12/04/2020   80 y.o.malewith medical history significant foraortic stenosis,chronic systolic CHF EF 20% by 2019 echocardiogram,mitral regurgitation, paroxysmal atrial fibrillation not on anticoagulation due to history of diverticular bleed 2019,thoracic aortic aneurysm, hypertension,severe malnutrition, alcohol abuse, who was prescribed medications but not currently taking any admitted on10/3/2022withepisode ofcollapsing /slumping over in a store.  10/4: cardio planning cath this Thu/fri after treatment of pneumonia   Subjective:  No change in his symptoms. he denies any issues then presyncopal spell. feels weak  Hospital Problems * Acute respiratory failure with hypoxia (HCC) Initially required 3 liter O2 in ED but now weaned off to RA. Likely due to pneumonia  Troponin I above reference range Likely demand ischemia. Cath planned this thu/fri  Pre-syncope Monitor on tele. Planned cath this Thu/fri by cardio. Has moderate aortic valve stenosis  Sepsis due to undetermined organism (HCC) from pneumonia. Continue Abx  Bacterial pneumonia Seen on CXR. Continue Rocephin + zithromax  Chronic systolic CHF (congestive heart failure) (HCC) Well compensated at this time. EF 20% on previous echo. Pending echo this admission.  Paroxysmal A-fib (HCC) Rate controlled. prn metoprolol. Not on anticoagulation due to h/o GI bleed in 2019. On baby ASA. Monitor on tele  Protein-calorie malnutrition, severe Poor po intake. Nutritionist c/s   AKI: likely prerenal from dehydration. Monitor with IVFs  Objective Vital signs were reviewed and unremarkable.  Vitals:   12/04/20 1405 12/04/20 1529 12/04/20 1859 12/04/20 2000  BP: 137/67 (!) 116/59  122/77  Pulse: 67 75    Resp: 16 18  18   Temp: (!) 97.5 F (36.4 C)   97.6 F (36.4 C)  TempSrc:    Axillary  SpO2: 98% (!) 89%   95%  Weight:   53.7 kg   Height:       53.7 kg  Exam Physical Exam  80 yo m looking chronically ill and cachectic  Lungs: rhonchi throughout both lungs Cardio: 3/6 SEM, irregularly irregular HS Abd: soft, benign Neuro: nonfocal. He mumbles and doesn't make sense. tremors + in hands Psych: normal mood and affect  Labs / Other Information My review of labs, imaging, notes and other tests is significant for elevated creatinine     Time spent: 35 mins [facility] 12/04/2020, 9:35 PM

## 2020-12-04 NOTE — Assessment & Plan Note (Addendum)
Rate controlled. prn metoprolol. Not on anticoagulation due to h/o GI bleed in 2019. On baby ASA. Monitor on tele

## 2020-12-05 DIAGNOSIS — D75838 Other thrombocytosis: Secondary | ICD-10-CM

## 2020-12-05 DIAGNOSIS — J159 Unspecified bacterial pneumonia: Secondary | ICD-10-CM | POA: Diagnosis not present

## 2020-12-05 DIAGNOSIS — Z7189 Other specified counseling: Secondary | ICD-10-CM | POA: Diagnosis not present

## 2020-12-05 DIAGNOSIS — J9601 Acute respiratory failure with hypoxia: Secondary | ICD-10-CM | POA: Diagnosis not present

## 2020-12-05 LAB — BASIC METABOLIC PANEL
Anion gap: 6 (ref 5–15)
BUN: 30 mg/dL — ABNORMAL HIGH (ref 8–23)
CO2: 24 mmol/L (ref 22–32)
Calcium: 9.2 mg/dL (ref 8.9–10.3)
Chloride: 108 mmol/L (ref 98–111)
Creatinine, Ser: 1.07 mg/dL (ref 0.61–1.24)
GFR, Estimated: >60 mL/min (ref >60)
Glucose, Bld: 113 mg/dL — ABNORMAL HIGH (ref 70–99)
Potassium: 3.8 mmol/L (ref 3.5–5.1)
Sodium: 138 mmol/L (ref 135–145)

## 2020-12-05 LAB — ECHOCARDIOGRAM COMPLETE
AR max vel: 0.54 cm2
AV Area VTI: 0.55 cm2
AV Area mean vel: 0.51 cm2
AV Mean grad: 28 mmHg
AV Peak grad: 45.3 mmHg
Ao pk vel: 3.36 m/s
Height: 72 in
P 1/2 time: 582 msec
S' Lateral: 3.48 cm
Weight: 1892.8 oz

## 2020-12-05 LAB — CBC
HCT: 38.8 % — ABNORMAL LOW (ref 39.0–52.0)
Hemoglobin: 11.9 g/dL — ABNORMAL LOW (ref 13.0–17.0)
MCH: 26.2 pg (ref 26.0–34.0)
MCHC: 30.7 g/dL (ref 30.0–36.0)
MCV: 85.5 fL (ref 80.0–100.0)
Platelets: 376 10*3/uL (ref 150–400)
RBC: 4.54 MIL/uL (ref 4.22–5.81)
RDW: 17.3 % — ABNORMAL HIGH (ref 11.5–15.5)
WBC: 7.9 10*3/uL (ref 4.0–10.5)
nRBC: 0 % (ref 0.0–0.2)

## 2020-12-05 MED ORDER — ENSURE ENLIVE PO LIQD
237.0000 mL | Freq: Three times a day (TID) | ORAL | Status: DC
  Administered 2020-12-06 – 2020-12-07 (×3): 237 mL via ORAL

## 2020-12-05 MED ORDER — GUAIFENESIN-DM 100-10 MG/5ML PO SYRP: 5.0000 mL | ORAL_SOLUTION | ORAL | Status: DC | PRN

## 2020-12-05 NOTE — Evaluation (Signed)
Physical Therapy Evaluation Patient Details Name: Austin Zamora MRN: [id number] DOB: 20-Apr-1940 Today's Date: 12/05/2020  History of Present Illness  Pt is a 80 y/o M with PMH:  aortic stenosis, chronic systolic CHF EF 20% by 2019 echocardiogram, mitral regurgitation, paroxysmal atrial fibrillation not on anticoagulation due to history of diverticular bleed 2019, thoracic aortic aneurysm, hypertension, severe malnutrition, and alcohol abuse. Pt was BIB his sister d/t collapse in a store while purchasing cigarettes. MD assessment includes acute respiratory failure with hypoxia, pre-syncope, sepsis, PNA, severe protein-calorie malnutrition, and AKI.   Clinical Impression  Pt was pleasant and motivated to participate during the session.  Pt required extra time and effort with bed mobility and transfers and min A for stability with ambulation.  Pt ambulated with poor gait quality and safety awareness with short, shuffling steps with heavy cues for amb closer to the RW and to stay within the RW during turns.  Pt has a history of recent falls and is at a high risk for future falls at this time.  Pt will benefit from PT services in a SNF setting upon discharge to safely address deficits listed in patient problem list for decreased caregiver assistance and eventual return to PLOF.         Recommendations for follow up therapy are one component of a multi-disciplinary discharge planning process, led by the attending physician.  Recommendations may be updated based on patient status, additional functional criteria and insurance authorization.  Follow Up Recommendations SNF;Supervision/Assistance - 24 hour    Equipment Recommendations  None recommended by PT    Recommendations for Other Services       Precautions / Restrictions Precautions Precautions: Fall Restrictions Weight Bearing Restrictions: No      Mobility  Bed Mobility Overal bed mobility: Modified Independent              General bed mobility comments: Extra time and effort only    Transfers Overall transfer level: Needs assistance Equipment used: Rolling walker (2 wheeled) Transfers: Sit to/from Stand Sit to Stand: Min guard         General transfer comment: Close CGA with extra time/effort and use of BUEs to come to standing with cues for RW use  Ambulation/Gait Ambulation/Gait assistance: Min assist Gait Distance (Feet): 30 Feet Assistive device: Rolling walker (2 wheeled) Gait Pattern/deviations: Step-through pattern;Decreased step length - right;Decreased step length - left;Trunk flexed;Shuffle Gait velocity: decreased   General Gait Details: Effortful, shuffling steps with heavy trunk flexion and max verbal cues for amb closer to the RW with upright posture and to stay within the RW during turns  Stairs            Wheelchair Mobility    Modified Rankin (Stroke Patients Only)       Balance Overall balance assessment: Needs assistance   Sitting balance-Leahy Scale: Fair Sitting balance - Comments: intermittent UE support for dynamic sitting, G static sitting balance   Standing balance support: Bilateral upper extremity supported;During functional activity Standing balance-Leahy Scale: Poor Standing balance comment: Min A for stability during ambulation                             Pertinent Vitals/Pain Pain Assessment: No/denies pain    Home Living Family/patient expects to be discharged to:: Private residence Living Arrangements: Alone Available Help at Discharge: Family;Available PRN/intermittently Type of Home: House Home Access: Stairs to enter Entrance Stairs-Rails: Right;Left;Can reach both Entrance [employer]  of Steps: 6 in front with B rails, 2 in back without rails Home Layout: One level Home Equipment: Cane - single point;Walker - 2 wheels      Prior Function Level of Independence: Needs assistance   Gait / Transfers Assistance Needed: Ind  amb limited community distances without AD, "a few" falls in the last 6 months secondary to LOB  ADL's / Homemaking Assistance Needed: Pt states he is Ind with ADLs but sister in room stated pt rarely bathes        Hand Dominance   Dominant Hand: Right    Extremity/Trunk Assessment   Upper Extremity Assessment Upper Extremity Assessment: Generalized weakness    Lower Extremity Assessment Lower Extremity Assessment: Generalized weakness       Communication   Communication: HOH  Cognition Arousal/Alertness: Awake/alert Behavior During Therapy: WFL for tasks assessed/performed Overall Cognitive Status: Within Functional Limits for tasks assessed                                 General Comments: While pt is able to follow most basic commands, he lacks safety awareness and provides conflicting PLOF information      General Comments      Exercises Total Joint Exercises Ankle Circles/Pumps: AROM;Strengthening;Both;10 reps Quad Sets: Both;Strengthening;10 reps Gluteal Sets: 10 reps;Both;Strengthening Hip ABduction/ADduction: Strengthening;Both;5 reps Straight Leg Raises: 5 reps;Both;Strengthening Long Arc Quad: Strengthening;Both;10 reps Knee Flexion: 10 reps;Both;Strengthening Other Exercises Other Exercises: HEP education for BLE APs, QS, and GS x 10 each every 1-2 hours daily   Assessment/Plan    PT Assessment Patient needs continued PT services  PT Problem List Decreased strength;Decreased activity tolerance;Decreased balance;Decreased mobility;Decreased knowledge of use of DME;Decreased safety awareness       PT Treatment Interventions DME instruction;Gait training;Stair training;Functional mobility training;Therapeutic activities;Therapeutic exercise;Balance training;Patient/family education    PT Goals (Current goals can be found in the Care Plan section)  Acute Rehab PT Goals Patient Stated Goal: To walk with better balance PT Goal Formulation:  With patient Time For Goal Achievement: 12/18/20 Potential to Achieve Goals: Fair    Frequency Min 2X/week   Barriers to discharge Inaccessible home environment;Decreased caregiver support      Co-evaluation               AM-PAC PT "6 Clicks" Mobility  Outcome Measure Help needed turning from your back to your side while in a flat bed without using bedrails?: A Little Help needed moving from lying on your back to sitting on the side of a flat bed without using bedrails?: A Little Help needed moving to and from a bed to a chair (including a wheelchair)?: A Little Help needed standing up from a chair using your arms (e.g., wheelchair or bedside chair)?: A Little Help needed to walk in hospital room?: A Lot Help needed climbing 3-5 steps with a railing? : Total 6 Click Score: 15    End of Session Equipment Utilized During Treatment: Gait belt Activity Tolerance: Patient tolerated treatment well Patient left: in bed;with call bell/phone within reach;with bed alarm set;with family/visitor present Nurse Communication: Mobility status PT Visit Diagnosis: Unsteadiness on feet (R26.81);History of falling (Z91.81);Difficulty in walking, not elsewhere classified (R26.2);Muscle weakness (generalized) (M62.81)    Time: 2025-4270 PT Time Calculation (min) (ACUTE ONLY): 31 min   Charges:   PT Evaluation $PT Eval Moderate Complexity: 1 Mod PT Treatments $Therapeutic Exercise: 8-22 mins       D.  Scott Antwaun Buth PT, DPT 12/05/20, 11:57 AM

## 2020-12-05 NOTE — Progress Notes (Signed)
Initial Nutrition Assessment  DOCUMENTATION CODES:   Severe malnutrition in context of chronic illness  INTERVENTION:   Ensure Enlive po TID, each supplement provides 350 kcal and 20 grams of protein  Magic cup TID with meals, each supplement provides 290 kcal and 9 grams of protein  MVI, folic acid and thiamine po daily   Pt at high refeed risk; recommend monitor potassium, magnesium and phosphorus labs daily until stable  NUTRITION DIAGNOSIS:   Severe Malnutrition related to chronic illness (CHF, etoh abuse) as evidenced by severe fat depletion, severe muscle depletion.  GOAL:   Patient will meet greater than or equal to 90% of their needs  MONITOR:   PO intake, Supplement acceptance, Labs, Weight trends, Skin, I & O's  REASON FOR ASSESSMENT:   Consult Assessment of nutrition requirement/status  ASSESSMENT:   80 y.o. male with medical history significant for aortic stenosis, chronic systolic CHF EF 12%, mitral regurgitation, paroxysmal atrial fibrillation not on anticoagulation due to history of diverticular bleed 2019, thoracic aortic aneurysm, hypertension, malnutrition and alcohol abuse who is admitted with PNA and sepsis.  Met with pt in room today. Pt reports good appetite and oral intake pta and in hospital. RD suspects pt with poor oral intake at baseline r/t etoh abuse as pt with severe malnutrition. Pt ate 90% of his breakfast this morning and ate 80% of his lunch. Pt reports that he loves chocolate Ensure and drinks this at home. RD will add supplements and vitamins to help pt meet his estimated needs. Pt is likely at refeed risk. Per chart, pt is down 33lbs(22%) since January; this is significant weight loss.   Medications reviewed and include: aspirin, lovenox, folic acid, MVI, thiamine, azithromycin, ceftriaxone  Labs reviewed: K 3.8 wnl, BUN 30(H)  NUTRITION - FOCUSED PHYSICAL EXAM:  Flowsheet Row Most Recent Value  Orbital Region Severe depletion  Upper  Arm Region Severe depletion  Thoracic and Lumbar Region Severe depletion  Buccal Region Severe depletion  Temple Region Severe depletion  Clavicle Bone Region Severe depletion  Clavicle and Acromion Bone Region Severe depletion  Scapular Bone Region Severe depletion  Dorsal Hand Severe depletion  Patellar Region Severe depletion  Anterior Thigh Region Severe depletion  Posterior Calf Region Severe depletion  Edema (RD Assessment) None  Hair Reviewed  Eyes Reviewed  Mouth Reviewed  Skin Reviewed  Nails Reviewed   Diet Order:   Diet Order             Diet regular Room service appropriate? Yes with Assist; Fluid consistency: Thin  Diet effective now                  EDUCATION NEEDS:   No education needs have been identified at this time  Skin:  Skin Assessment: Reviewed RN Assessment  Last BM:  pta  Height:   Ht Readings from Last 1 Encounters:  12/03/20 6' (1.829 m)    Weight:   Wt Readings from Last 1 Encounters:  12/05/20 53.3 kg    Ideal Body Weight:  80.9 kg  BMI:  Body mass index is 15.94 kg/m.  Estimated Nutritional Needs:   Kcal:  1800-2100kcal/day  Protein:  90-105g/day  Fluid:  1.3-1.6L/day  Koleen Distance MS, RD, LDN Please refer to [facility] for RD and/or RD on-call/weekend/after hours pager

## 2020-12-05 NOTE — Consult Note (Signed)
Consultation Note Date: 12/05/2020   Patient Name: Austin Zamora  DOB: 24-Nov-1940  MRN: [id number]  Age / Sex: 80 y.o., male  PCP: Alwyn Pea, MD Referring Physician: Marrion Coy, MD  Reason for Consultation: Establishing goals of care  HPI/Patient Profile: 80 y.o. male with medical history significant for aortic stenosis, chronic systolic CHF EF 20% by 2019 echocardiogram, mitral regurgitation, paroxysmal atrial fibrillation not on anticoagulation due to history of diverticular bleed 2019, thoracic aortic aneurysm, hypertension, severe malnutrition, alcohol abuse, who was prescribed medications but not currently taking any admitted on 12/03/2020 with episode of collapsing / slumping over in a store.  Clinical Assessment and Goals of Care: Patient is resting in bed with brother at bedside. Patient states he is divorced, and has 1/2 children living. He denies SOB or other issue at baseline. He states he does his own cooking and cleaning. Brother interjects that he does not cook or clean. He also discusses concern for his weight loss and very poor PO intake.   We discussed his diagnosis, prognosis, GOC, EOL wishes disposition and options.  Created space and opportunity for patient  to explore thoughts and feelings regarding current medical information.   A detailed discussion was had today regarding advanced directives.  Concepts specific to code status, artifical feeding and hydration, IV antibiotics and rehospitalization were discussed.  The difference between an aggressive medical intervention path and a comfort care path was discussed.  Values and goals of care important to patient and family were attempted to be elicited.  Discussed limitations of medical interventions to prolong quality of life in some situations and discussed the concept of human mortality.  Patient states he does not take his  medications at home, and wants to do things on his terms. He states he does not want to return to the hospital, and is clear he would not have wanted to come this time. Discussed home with hospice. Patient would like this option. Brother speaks up that he needs to talk to his son first. Discussed HPOA paper completion as patient states he is not interested in what his son wants; patient would like his sister to be HPOA. Brother insists that patient speak with his son first, and patient is amenable. Plans to return in the morning for further conversation.    SUMMARY OF RECOMMENDATIONS   Will return tomorrow morning. Patient wants home with hospice but will talk with family first.  Prognosis:  < 6 months       Primary Diagnoses: Present on Admission:  Acute respiratory failure with hypoxia (HCC)  Bacterial pneumonia  Sepsis due to undetermined organism (HCC)  Alcohol abuse  Protein-calorie malnutrition, severe  Paroxysmal A-fib (HCC)  Acute kidney injury (HCC)  Chronic systolic CHF (congestive heart failure) (HCC)  Thoracic aortic aneurysm without rupture  Pre-syncope  Troponin I above reference range  Elevated liver function tests   I have reviewed the medical record, interviewed the patient and family, and examined the patient. The following aspects are pertinent.  Past Medical  History:  Diagnosis Date   Alcohol abuse    Aortic stenosis    nonrheumatic   Chronic systolic CHF (congestive heart failure) (HCC)    Echo 05/07/17: EF 20%, diffuse hypokinesis, moderate mitral regurgitation, severe oartic stenosis   GI bleed 02/04/2018   Procedures on 02/04/18: Colonoscopy: Diverticulosis and blood, no specimens. EGD: Gastritis, Normal esophagus and duodenum, 1 cm hiatal hernia, no specimens.   Hypertension    Mitral regurgitation    Echo 05/07/17: [place] mitral regurgitation   Odontoid fracture (HCC) 03/07/2020   After a fall. CT: Fracture through the inferior odontoid with mild  displacement   Paroxysmal A-fib (HCC)    Protein-calorie malnutrition, severe 05/17/2017   SOBOE (shortness of breath on exertion)    Thoracic aortic aneurysm without rupture 05/27/2017   Ascending aorta dilation 5.4 vm on 09/22/17 cath and CT.   Social History   Socioeconomic History   Marital status: Divorced    Spouse name: Not on file   Number of children: Not on file   Years of education: Not on file   Highest education level: Not on file  Occupational History   Not on file  Tobacco Use   Smoking status: Every Day    Packs/day: 1.00    Years: 60.00    Pack years: 60.00    Types: Cigarettes   Smokeless tobacco: Never  Substance and Sexual Activity   Alcohol use: Not Currently    Alcohol/week: 45.0 standard drinks    Types: 45 Cans of beer per week    Comment: Previously drank two 40s daily. Reports drinking 24-40 oz of beer daily as 12/2020.   Drug use: No   Sexual activity: Not on file  Other Topics Concern   Not on file  Social History Narrative   Not on file   Social Determinants of Health   Financial Resource Strain: Not on file  Food Insecurity: Not on file  Transportation Needs: Not on file  Physical Activity: Not on file  Stress: Not on file  Social Connections: Not on file   Family History  Problem Relation Age of Onset   Breast cancer Mother    Parkinson's disease Father    Scheduled Meds:  aspirin EC  81 mg Oral Daily   enoxaparin (LOVENOX) injection  40 mg Subcutaneous Q24H   folic acid  1 mg Oral Daily   multivitamin with minerals  1 tablet Oral Daily   thiamine injection  100 mg Intravenous Daily   Continuous Infusions:  azithromycin 500 mg (12/05/20 1542)   cefTRIAXone (ROCEPHIN)  IV 2 g (12/05/20 1454)   PRN Meds:.acetaminophen **OR** acetaminophen, bisacodyl, guaiFENesin-dextromethorphan, LORazepam **OR** LORazepam, morphine injection, ondansetron **OR** ondansetron (ZOFRAN) IV, oxyCODONE, senna-docusate Medications Prior to Admission:   Prior to Admission medications   Medication Sig Start Date End Date Taking? Authorizing Provider  feeding supplement, ENSURE ENLIVE, (ENSURE ENLIVE) LIQD Take 237 mLs by mouth 3 (three) times daily between meals. 05/18/17   Adrian Saran, MD  furosemide (LASIX) 20 MG tablet Take 20 mg by mouth daily.  Patient not taking: Reported on 12/03/2020 04/06/17   [provider]  hydrochlorothiazide (MICROZIDE) 12.5 MG capsule Take 1 capsule by mouth daily. Patient not taking: Reported on 12/03/2020 03/03/20   [provider]  lisinopril (PRINIVIL,ZESTRIL) 20 MG tablet Take 20 mg by mouth daily.  Patient not taking: Reported on 12/03/2020 05/15/17   [provider]  metoprolol succinate (TOPROL-XL) 50 MG 24 hr tablet Take 50 mg by mouth  daily.  Patient not taking: Reported on 12/03/2020 04/06/17   [provider]  nicotine (NICODERM CQ - DOSED IN MG/24 HOURS) 14 mg/24hr patch Place 1 patch (14 mg total) onto the skin daily. Patient not taking: No sig reported 05/18/17   Adrian Saran, MD  pantoprazole (PROTONIX) 40 MG tablet Take 1 tablet (40 mg total) by mouth daily. Patient not taking: Reported on 12/03/2020 02/06/18   Adrian Saran, MD  PROAIR HFA 108 [phone] Base) MCG/ACT inhaler Inhale 2 puffs into the lungs every 4 (four) hours as needed for wheezing or shortness of breath.  Patient not taking: Reported on 12/03/2020 05/15/17   [provider]  traMADol (ULTRAM) 50 MG tablet Take 1 tablet (50 mg total) by mouth 3 (three) times daily as needed. Patient not taking: Reported on 12/03/2020 03/07/20 03/07/21  Phineas Semen, MD   No Known Allergies Review of Systems  All other systems reviewed and are negative.  Physical Exam Pulmonary:     Effort: Pulmonary effort is normal.  Neurological:     Mental Status: He is alert.    Vital Signs: BP (!) 146/69 (BP Location: Right Arm)   Pulse (!) 54   Temp 97.6 F (36.4 C)   Resp 18   Ht 6' (1.829 m)   Wt 53.3 kg   SpO2 98%    BMI 15.94 kg/m  Pain Scale: 0-10   Pain Score: 0-No pain   SpO2: SpO2: 98 % O2 Device:SpO2: 98 % O2 Flow Rate: .O2 Flow Rate (L/min): 3 L/min  IO: Intake/output summary:  Intake/Output Summary (Last 24 hours) at 12/05/2020 1544 Last data filed at 12/05/2020 1042 Gross per 24 hour  Intake 4200.4 ml  Output 885 ml  Net 3315.4 ml    LBM: Last BM Date: 12/03/20 Baseline Weight: Weight: 60 kg Most recent weight: Weight: 53.3 kg        Time In: 3:00 Time Out: 3:30 Time Total: 30 min Greater than 50%  of this time was spent counseling and coordinating care related to the above assessment and plan.  Signed by: Morton Stall, NP   Please contact Palliative Medicine Team phone at [phone] for questions and concerns.  For individual provider: See Loretha Stapler

## 2020-12-05 NOTE — Evaluation (Signed)
Occupational Therapy Evaluation Patient Details Name: Austin Zamora MRN: [id number] DOB: 09/26/1940 Today's Date: 12/05/2020   History of Present Illness 80 y/o M with PMH:  aortic stenosis, chronic systolic CHF EF 20% by 2019 echocardiogram, mitral regurgitation, paroxysmal atrial fibrillation not on anticoagulation due to history of diverticular bleed 2019, thoracic aortic aneurysm, hypertension, severe malnutrition, and alcohol abuse. Pt was BIB his sister d/t collapse in a store while purchasing cigarettes. Pt adm for PNA.   Clinical Impression   Pt seen for OT evaluation this date in setting of acute hospitalization d/t respiratory failure. Pt reports being INDEP at baseline and living on his own. Pt is questionable historian as he is not able to accurately state the month or year. Pt presents with decreased strength of trunk and limbs as well as general deconditioning. He requires CGA/MIN A for STS and fxl mobility with RW. For seated LB ADLs, he requires CGA and requires MIN A for standing LB ADLs d/t decreased dynamic standing balance. Anticipate pt will require STR upon d/c from acute setting d/t decreased balance and safety awareness.       Recommendations for follow up therapy are one component of a multi-disciplinary discharge planning process, led by the attending physician.  Recommendations may be updated based on patient status, additional functional criteria and insurance authorization.   Follow Up Recommendations  SNF    Equipment Recommendations  Other (comment) (defer)    Recommendations for Other Services       Precautions / Restrictions Precautions Precautions: Fall Restrictions Weight Bearing Restrictions: No      Mobility Bed Mobility               General bed mobility comments: not assesses, seated EOB when OT presents for tx    Transfers Overall transfer level: Needs assistance Equipment used: Rolling walker (2 wheeled) Transfers: Sit to/from  Stand Sit to Stand: Supervision;Min guard         General transfer comment: Pt with poor safety awareness, requires cues for RW use    Balance Overall balance assessment: Needs assistance   Sitting balance-Leahy Scale: Fair Sitting balance - Comments: intermittent UE support for dynamic sitting, G static sitting balance     Standing balance-Leahy Scale: Poor Standing balance comment: requires UE Support on RW and CGA/MIN A to sustain balance                           ADL either performed or assessed with clinical judgement   ADL Overall ADL's : Needs assistance/impaired                                       General ADL Comments: Pt requires SETUP for seated UB ADLs, MIN/MOD A For LB ADLs sitting/standing d/t decreased dynamic balance. While he declines walker use, when placed in front of him, pt clearly benefits from UE Support for standing and fxl mobility.     Vision Patient Visual Report: No change from baseline       Perception     Praxis      Pertinent Vitals/Pain Pain Assessment: No/denies pain     Hand Dominance Right   Extremity/Trunk Assessment Upper Extremity Assessment Upper Extremity Assessment: Generalized weakness   Lower Extremity Assessment Lower Extremity Assessment: Generalized weakness       Communication Communication Communication: HOH   Cognition Arousal/Alertness:  Awake/alert Behavior During Therapy: WFL for tasks assessed/performed Overall Cognitive Status: No family/caregiver present to determine baseline cognitive functioning                                 General Comments: While pt is able to follow most basic commands, he lacks safety awareness and provides conflicting PLOF information   General Comments       Exercises Other Exercises Other Exercises: OT ed pt re: safety including use of AD for fxl mobility, pt with MIN/MOD reception, will require f/u education   Shoulder  Instructions      Home Living Family/patient expects to be discharged to:: Private residence Living Arrangements: Alone Available Help at Discharge: Family;Available PRN/intermittently (reports loosely having assist from sister or DIL, ?? historian) Type of Home: House Home Access: Stairs to enter [employer] of Steps: 6 STE in front, 3 in back   Home Layout: One level               Home Equipment: Cane - single point;Walker - 2 wheels          Prior Functioning/Environment Level of Independence: Needs assistance  Gait / Transfers Assistance Needed: Pt reports having walker and cane, but states he doesn't use. Unsure of efficacy ADL's / Homemaking Assistance Needed: Pt is ?? historian about driving and IADLs such as getting groceries. Reports he drives, but then also states he doesn't because his DIL has to get him new tags. Unsure of how pt procures basic needs.            OT Problem List: Decreased strength;Decreased activity tolerance;Decreased safety awareness;Decreased cognition      OT Treatment/Interventions: Self-care/ADL training;Therapeutic exercise;Therapeutic activities    OT Goals(Current goals can be found in the care plan section) Acute Rehab OT Goals Patient Stated Goal: none stated OT Goal Formulation: With patient Time For Goal Achievement: 12/19/20 Potential to Achieve Goals: Good ADL Goals Pt Will Perform Lower Body Dressing: with supervision;sit to/from stand (with LRAD PRN, with G sitting balnace, F standing) Pt Will Transfer to Toilet: with supervision;ambulating (to/from restroom with LRAD PRN with F balance w/ improved foot clearance for fall prevention) Pt Will Perform Toileting - Clothing Manipulation and hygiene: with supervision;sit to/from stand  OT Frequency: Min 2X/week   Barriers to D/C: Decreased caregiver support          Co-evaluation              AM-PAC OT "6 Clicks" Daily Activity     Outcome Measure Help  from another person eating meals?: None Help from another person taking care of personal grooming?: None Help from another person toileting, which includes using toliet, bedpan, or urinal?: A Little Help from another person bathing (including washing, rinsing, drying)?: A Little Help from another person to put on and taking off regular upper body clothing?: None Help from another person to put on and taking off regular lower body clothing?: A Little 6 Click Score: 21   End of Session Equipment Utilized During Treatment: Rolling walker Nurse Communication: Mobility status  Activity Tolerance: Patient tolerated treatment well Patient left: in chair;with call bell/phone within reach;with chair alarm set  OT Visit Diagnosis: Unsteadiness on feet (R26.81);Other abnormalities of gait and mobility (R26.89);Muscle weakness (generalized) (M62.81)                Time: 1950-9326 OT Time Calculation (min): 20 min Charges:  OT  General Charges $OT Visit: 1 Visit OT Evaluation $OT Eval Moderate Complexity: 1 Mod OT Treatments $Self Care/Home Management : 8-22 mins  Rejeana Brock, MS, OTR/L ascom [phone] 12/05/20, 10:28 AM

## 2020-12-05 NOTE — TOC Initial Note (Signed)
Transition of Care [facility]) - Initial/Assessment Note    Patient Details  Name: Austin Zamora MRN: [id number] Date of Birth: [birth year]  Transition of Care [facility]) CM/SW Contact:    Gildardo Griffes, LCSW Phone Number: 12/05/2020, 12:21 PM  Clinical Narrative:                  CSW met with patient at bedside to inform of SNF recommendation for short term rehab. Patient reports he wants to go home and declines SNF at this time, reports his sister checks on him often at home. Although declining SNF, patient reports he is agreeable to home health services.   CSW to set up max HH services of PT, OT, RN, aide and social worker in the home. Referrals sent out to [facility] agencies, pending acceptance at this time.   CSW notes current alcohol use, provided patient with list of substance abuse resources. List left at bedside.   Patient reports he has a walker at home and identified no further DME needs.    Expected Discharge Plan: Home w Home Health Services Barriers to Discharge: Continued Medical Work up   Patient Goals and CMS Choice Patient states their goals for this hospitalization and ongoing recovery are:: to go home CMS Medicare.gov Compare Post Acute Care list provided to:: Patient Choice offered to / list presented to : Patient  Expected Discharge Plan and Services Expected Discharge Plan: Home w Home Health Services     Post Acute Care Choice: Home Health Living arrangements for the past 2 months: Single Family Home                           HH Arranged: PT, OT, RN, Nurse's Aide, Social Work [employer] Agency:  (referrals sent pending acceptance) Date HH Agency Contacted: 12/05/20      Prior Living Arrangements/Services Living arrangements for the past 2 months: Single Family Home Lives with:: Self Patient language and need for interpreter reviewed:: Yes Do you feel safe going back to the place where you live?: Yes      Need for Family Participation in Patient Care: Yes  (Comment) Care giver support system in place?: Yes (comment)   Criminal Activity/Legal Involvement Pertinent to Current Situation/Hospitalization: No - Comment as needed  Activities of Daily Living Home Assistive Devices/Equipment: None ADL Screening (condition at time of admission) Patient's cognitive ability adequate to safely complete daily activities?: No Is the patient deaf or have difficulty hearing?: No Does the patient have difficulty seeing, even when wearing glasses/contacts?: No Does the patient have difficulty concentrating, remembering, or making decisions?: Yes Patient able to express need for assistance with ADLs?: Yes Does the patient have difficulty dressing or bathing?: Yes Independently performs ADLs?: No Does the patient have difficulty walking or climbing stairs?: Yes Weakness of Legs: Both Weakness of Arms/Hands: None  Permission Sought/Granted                  Emotional Assessment Appearance:: Appears stated age Attitude/Demeanor/Rapport: Gracious Affect (typically observed): Calm Orientation: : Oriented to Self, Oriented to Place, Oriented to  Time, Oriented to Situation Alcohol / Substance Use: Not Applicable Psych Involvement: No (comment)  Admission diagnosis:  Hypoxia [R09.02] Abnormal chest x-ray [R93.89] Acute respiratory failure with hypoxia (HCC) [J96.01] Near syncope [R55] Aortic valve stenosis, etiology of cardiac valve disease unspecified [I35.0] Patient Active Problem List   Diagnosis Date Noted   Acute respiratory failure with hypoxia (HCC) 12/03/2020  Bacterial pneumonia 12/03/2020   Sepsis due to undetermined organism ([place]) 12/03/2020   Pre-syncope 12/03/2020   Troponin I above reference range 12/03/2020   Elevated liver function tests 12/03/2020   Pressure injury of skin 82/99/3716   Chronic systolic CHF (congestive heart failure) ([place]) 06/10/2017   Thoracic aortic aneurysm without rupture 05/27/2017   Benign essential HTN  05/27/2017   Acute renal failure (ARF) (HCC)    Acute kidney injury ([place])    Nonrheumatic aortic valve stenosis    Influenza A 05/17/2017   Protein-calorie malnutrition, severe 05/17/2017   Paroxysmal A-fib ([place]) 04/06/2017   SOBOE (shortness of breath on exertion) 04/06/2017   Alcohol abuse 10/16/2014   Moderate aortic valve stenosis 09/06/2014   PCP:  Yolonda Kida, MD Pharmacy:   [facility] [address]), [place] [address] [place])  [address] Phone: [phone] Fax: [phone]     Social Determinants of Health (SDOH) Interventions    Readmission Risk Interventions No flowsheet data found.

## 2020-12-05 NOTE — Progress Notes (Signed)
SUBJECTIVE: Austin Zamora is a 79 y.o. male with medical history significant for aortic stenosis, chronic systolic CHF EF 20% by 2019 echocardiogram, mitral regurgitation, paroxysmal atrial fibrillation not on anticoagulation due to history of diverticular bleed 2019, thoracic aortic aneurysm, hypertension, malnutrition, alcohol abuse, who was prescribed medications but not currently taking any, who presented to the emergency department on 12/03/2020 with episode of collapsing / slumping over in a store.  Patient denies chest pain, shortness of breath, dizziness.    Vitals:   12/05/20 0011 12/05/20 0446 12/05/20 0600 12/05/20 0900  BP: 131/88 133/75 133/75 (!) 127/98  Pulse: 65  68 82  Resp: 18 18    Temp: 98.8 F (37.1 C) 97.9 F (36.6 C)  98 F (36.7 C)  TempSrc: Axillary Axillary    SpO2: 98% 98%    Weight:  53.3 kg    Height:        Intake/Output Summary (Last 24 hours) at 12/05/2020 1038 Last data filed at 12/05/2020 0655 Gross per 24 hour  Intake 3960.4 ml  Output 385 ml  Net 3575.4 ml    LABS: Basic Metabolic Panel: Recent Labs    12/03/20 1418 12/04/20 0329 12/05/20 0611  NA  --  138 138  K  --  4.2 3.8  CL  --  110 108  CO2  --  21* 24  GLUCOSE  --  92 113*  BUN  --  39* 30*  CREATININE  --  1.27* 1.07  CALCIUM  --  8.3* 9.2  MG 2.1  --   --    Liver Function Tests: Recent Labs    12/03/20 1122  AST 47*  ALT 16  ALKPHOS 83  BILITOT 1.6*  PROT 6.9  ALBUMIN 3.6   No results for input(s): LIPASE, AMYLASE in the last 72 hours. CBC: Recent Labs    12/03/20 1122 12/04/20 0329 12/05/20 0611  WBC 11.9* 7.9 7.9  NEUTROABS 10.6*  --   --   HGB 13.9 11.2* 11.9*  HCT 45.2 35.3* 38.8*  MCV 85.1 84.7 85.5  PLT 490* 325 376   Cardiac Enzymes: No results for input(s): CKTOTAL, CKMB, CKMBINDEX, TROPONINI in the last 72 hours. BNP: Invalid input(s): POCBNP D-Dimer: Recent Labs    12/03/20 1418  DDIMER 3.28*   Hemoglobin A1C: No results for  input(s): HGBA1C in the last 72 hours. Fasting Lipid Panel: Recent Labs    12/04/20 0329  CHOL 87  HDL 48  LDLCALC 30  TRIG 43  CHOLHDL 1.8   Thyroid Function Tests: No results for input(s): TSH, T4TOTAL, T3FREE, THYROIDAB in the last 72 hours.  Invalid input(s): FREET3 Anemia Panel: No results for input(s): VITAMINB12, FOLATE, FERRITIN, TIBC, IRON, RETICCTPCT in the last 72 hours.   PHYSICAL EXAM General: Well developed, well nourished, in no acute distress HEENT:  Normocephalic and atramatic Neck:  No JVD.  Lungs: Clear bilaterally to auscultation and percussion. Heart: irregular rhythm. 3/6 systolic murmur.  Abdomen: Bowel sounds are positive, abdomen soft and non-tender  Msk:  Back normal, normal gait. Normal strength and tone for age. Extremities: No clubbing, cyanosis or edema.   Neuro: Alert and oriented X 3. Psych:  Good affect, responds appropriately  TELEMETRY: atrial fibrillation, HR 64 bpm  ASSESSMENT AND PLAN: Discussed heart catheterization with patient. Patient agreeable to proceed when pneumonia has cleared.   Principal Problem:   Acute respiratory failure with hypoxia (HCC) Active Problems:   Moderate aortic valve stenosis   Protein-calorie malnutrition, severe  Alcohol abuse   Paroxysmal A-fib (HCC)   Acute kidney injury (HCC)   Chronic systolic CHF (congestive heart failure) (HCC)   Thoracic aortic aneurysm without rupture   Bacterial pneumonia   Sepsis due to undetermined organism (HCC)   Pre-syncope   Troponin I above reference range   Elevated liver function tests    Ammanda Dobbins, FNP-C 12/05/2020 10:38 AM

## 2020-12-05 NOTE — Evaluation (Addendum)
Clinical/Bedside Swallow Evaluation Patient Details  Name: Austin Zamora MRN: [id number] Date of Birth: 1940-04-24  Today's Date: 12/05/2020 Time: SLP Start Time (ACUTE ONLY): 1000 SLP Stop Time (ACUTE ONLY): 1100 SLP Time Calculation (min) (ACUTE ONLY): 60 min  Past Medical History:  Past Medical History:  Diagnosis Date   Alcohol abuse    Aortic stenosis    nonrheumatic   Chronic systolic CHF (congestive heart failure) (HCC)    Echo 05/07/17: EF 20%, diffuse hypokinesis, moderate mitral regurgitation, severe oartic stenosis   GI bleed 02/04/2018   Procedures on 02/04/18: Colonoscopy: Diverticulosis and blood, no specimens. EGD: Gastritis, Normal esophagus and duodenum, 1 cm hiatal hernia, no specimens.   Hypertension    Mitral regurgitation    Echo 05/07/17: Modearate mitral regurgitation   Odontoid fracture (HCC) 03/07/2020   After a fall. CT: Fracture through the inferior odontoid with mild displacement   Paroxysmal A-fib (HCC)    Protein-calorie malnutrition, severe 05/17/2017   SOBOE (shortness of breath on exertion)    Thoracic aortic aneurysm without rupture 05/27/2017   Ascending aorta dilation 5.4 vm on 09/22/17 cath and CT.   Past Surgical History:  Past Surgical History:  Procedure Laterality Date   COLONOSCOPY N/A 02/04/2018   Procedure: COLONOSCOPY;  Surgeon: Toledo, Boykin Nearing, MD;  Location: [facility] ENDOS[facility];  Service: Gastroenterology;  Laterality: N/A;   ESOPHAGOGASTRODUODENOSCOPY N/A 02/04/2018   Procedure: ESOPHAGOGASTRODUODENOSCOPY (EGD);  Surgeon: Toledo, Boykin Nearing, MD;  Location: [facility] ENDOSCOPY;  Service: Gastroenterology;  Laterality: N/A;   RIGHT/LEFT HEART CATH AND CORONARY ANGIOGRAPHY Bilateral 09/22/2017   Procedure: RIGHT/LEFT HEART CATH AND CORONARY ANGIOGRAPHY;  Surgeon: Lamar Blinks, MD;  Location: [facility];  Service: Cardiovascular;  Laterality: Bilateral;   HPI:  Pt is a 80 y.o. male with medical history significant for ETOH use and abuse,  malnutrition, Esophageal Dysmotility and Gastritis, aortic stenosis, chronic systolic CHF EF 20% by 2019 echocardiogram, mitral regurgitation, paroxysmal atrial fibrillation not on anticoagulation due to history of diverticular bleed 2019, thoracic aortic aneurysm, hypertension who was prescribed medications but not currently taking any, who presents to the emergency department on 12/03/2020 with episode of collapsing / slumping over in a store. Onset of collapsing was on the day of admission and duration was brief. He went to a tobacco store to get cigarettes and collapsed at the counter.  CXR/CT of chest: Emphysema (ICD10-J43.9).  5. Complete plugging of the right lower lobe bronchus, with  nodularity in the right lower lobe which is likely  inflammatory/infectious, as well as some airspace opacity in the  right lower lobe which may reflect pneumonia or atelectasis.  6. Small right pleural effusion, containing a densely 2.1 cm  calcified structure similar to the prior exam.  7. Mild bronchiectasis and airway thickening posteriorly in the  right upper lobe.  OF NOTE: pt has a similar CXR presentation in 11-01/2018 and was seen by GI for Esophageal Dysmotility; Endoscopy then revealed hiatal hernia, Gastritis.    Assessment / Plan / Recommendation  Clinical Impression  Pt appears to present w/ grossly adequate oropharyngeal phase swallow function w/ No oropharyngeal phase dysphagia noted, No neuromuscular deficits noted. Pt consumed po trials w/ No overt, clinical s/s of aspiration during po trials. Pt appears at reduced risk for aspiration following general aspiration precautions. Pt is Missing Most Dentition which can impact mastication of solid foods.     During po trials, pt consumed all consistencies w/ no overt coughing, decline in vocal quality, or change in  respiratory presentation during/post trials-- 10+ trials of thin liquids via Cup and via Straw. Instructed on Single Small sips, Slowly. Oral phase  appeared [facility] w/ timely bolus management of liquids and purees; increased mastication time/effort noted w/ soft solids d/t Missing Dentition. Oral control of bolus propulsion for A-P transfer for swallowing was wfl. Oral clearing achieved w/ all trial consistencies. OM Exam appeared [facility] w/ no unilateral weakness noted. Speech Clear; low volume. Pt fed self w/ setup support.   Recommend continue a Regular consistency diet(for options to encourage oral intake d/t Malnutrition) w/ well-Cut meats, moistened foods; Thin liquids. Recommend general aspiration precautions, Pills WHOLE in Puree for safer, easier swallowing. Reflux precautions. Education given on Pills in Puree; food consistencies and easy to eat options; general aspiration precautions. NSG to reconsult if any new needs arise. MD/NSG agreed     OF NOTE: Suspect pt could be at risk for aspiration of REFLUX material d/t his c/o "indigestion sometimes" and the ETOH use. W/ ETOH use/abuse and a h/o Gastritis previously, he is at risk. ANY Esophageal REGURGITATION could increased risk for aspiration of REFLUX material thus impact the Pulmonary status. Recommend f/u w/ GI for assessment and management post discharge. Pt has been on a PPI in the past per chart notes. SLP Visit Diagnosis: Dysphagia, unspecified (R13.10)    Aspiration Risk   (reduced)    Diet Recommendation   Regular consistency diet(for options to encourage oral intake d/t Malnutrition) w/ well-Cut meats, moistened foods; Thin liquids. Recommend general aspiration precautions, Reflux precautions.  Medication Administration: Whole meds with puree (for safer swallowing - recommended)    Other  Recommendations Recommended Consults:  (f/u w/ GI if increased Reflux s/s; Dietician for nutritional support) Oral Care Recommendations: Oral care BID;Oral care before and after PO;Patient independent with oral care (support) Other Recommendations:  (n/a)    Recommendations for follow up therapy  are one component of a multi-disciplinary discharge planning process, led by the attending physician.  Recommendations may be updated based on patient status, additional functional criteria and insurance authorization.  Follow up Recommendations None      Frequency and Duration  (n/a)   (n/a)       Prognosis Prognosis for Safe Diet Advancement: Fair (-Good) Barriers to Reach Goals: Motivation;Time post onset;Severity of deficits;Behavior (ETOH abuse/use; FTT appearance)      Swallow Study   General Date of Onset: 12/03/20 HPI: Pt is a 80 y.o. male with medical history significant for ETOH use and abuse, malnutrition, Esophageal Dysmotility and Gastritis, aortic stenosis, chronic systolic CHF EF 20% by 2019 echocardiogram, mitral regurgitation, paroxysmal atrial fibrillation not on anticoagulation due to history of diverticular bleed 2019, thoracic aortic aneurysm, hypertension who was prescribed medications but not currently taking any, who presents to the emergency department on 12/03/2020 with episode of collapsing / slumping over in a store. Onset of collapsing was on the day of admission and duration was brief. He went to a tobacco store to get cigarettes and collapsed at the counter.  CXR/CT of chest: Emphysema (ICD10-J43.9).  5. Complete plugging of the right lower lobe bronchus, with  nodularity in the right lower lobe which is likely  inflammatory/infectious, as well as some airspace opacity in the  right lower lobe which may reflect pneumonia or atelectasis.  6. Small right pleural effusion, containing a densely 2.1 cm  calcified structure similar to the prior exam.  7. Mild bronchiectasis and airway thickening posteriorly in the  right upper lobe.  OF NOTE: pt  has a similar CXR presentation in 11-01/2018 and was seen by GI for Esophageal Dysmotility; Endoscopy then revealed hiatal hernia, Gastritis. Type of Study: Bedside Swallow Evaluation Previous Swallow Assessment: bse [id number]/[id number] Diet  Prior to this Study: Regular;Thin liquids (not eating much at home) Temperature Spikes Noted: No (wbc wnl) Respiratory Status: Room air History of Recent Intubation: No Behavior/Cognition: Alert;Cooperative;Pleasant mood;Confused;Distractible;Requires cueing Oral Cavity Assessment: Dry Oral Care Completed by SLP: Yes Oral Cavity - Dentition: Missing dentition;Poor condition (most) Vision: Functional for self-feeding Self-Feeding Abilities: Able to feed self;Needs set up Patient Positioning: Upright in bed (needed positioning support, cues) Baseline Vocal Quality: Low vocal intensity Volitional Cough: Strong Volitional Swallow: Able to elicit    Oral/Motor/Sensory Function Overall Oral Motor/Sensory Function: Within functional limits   Ice Chips Ice chips: Within functional limits (2 trials) Presentation: Spoon (fed; 2 trials)   Thin Liquid Thin Liquid: Within functional limits Presentation: Cup;Self Fed (10 trials)    Nectar Thick Nectar Thick Liquid: Not tested   Honey Thick Honey Thick Liquid: Not tested   Puree Puree: Within functional limits Presentation: Self Fed;Spoon (5 trials)   Solid     Solid: Within functional limits (adequate - missing teeth) Presentation: Self Fed;Spoon (4 trials)        Jerilynn Som, MS, CCC-SLP Speech Language Pathologist Rehab Services [phone] Gara Kincade 12/05/2020,1:55 PM

## 2020-12-05 NOTE — Progress Notes (Signed)
PROGRESS NOTE    Austin Zamora  JME:[id number] DOB: December 27, 1940 DOA: 12/03/2020 PCP: Alwyn Pea, MD    Brief Narrative:  80 y.o. male with medical history significant for aortic stenosis, chronic systolic CHF EF 20% by 2019 echocardiogram, mitral regurgitation, paroxysmal atrial fibrillation not on anticoagulation due to history of diverticular bleed 2019, thoracic aortic aneurysm, hypertension, severe malnutrition, alcohol abuse, who was prescribed medications but not currently taking any admitted on 12/03/2020 with episode of collapsing / slumping over in a store.   10/4: cardio planning cath this Thu/fri after treatment of pneumonia    Assessment & Plan:   Principal Problem:   Acute respiratory failure with hypoxia (HCC) Active Problems:   Moderate aortic valve stenosis   Protein-calorie malnutrition, severe   Alcohol abuse   Paroxysmal A-fib (HCC)   Acute kidney injury (HCC)   Chronic systolic CHF (congestive heart failure) (HCC)   Thoracic aortic aneurysm without rupture   Bacterial pneumonia   Sepsis due to undetermined organism (HCC)   Pre-syncope   Troponin I above reference range   Elevated liver function tests  Acute respiratory failure with hypoxemia secondary to pneumonia. Aspiration pneumonia. Elevated troponin secondary to acute hypoxemia. Discussed with speech therapist, patient does not have overt aspiration at this time.  Patient does have significant acid reflux, which could be the source of aspiration pneumonia. Patient currently on Rocephin and Zithromax, but condition is improving.  No need to adjust antibiotics.  Presyncope secondary to moderate aortic valve stenosis. Moderate aortic valve stenosis. Chronic systolic congestive heart failure.   Paroxysmal atrial fibrillation. Patient has no evidence of volume overload.  Not on anticoagulation due to history of GI bleed. No additional syncope episodes.  Sepsis secondary to pneumonia. Condition had  improved.  Alcohol abuse. No evidence of withdrawal.  Monitor magnesium level, continue thiamine folic acid.  Chronic kidney disease stage IIIa. Renal function stable.  DVT prophylaxis: Lovenox Code Status: full Family Communication: Sister at bedside. Disposition Plan:    Status is: Inpatient  Remains inpatient appropriate because:IV treatments appropriate due to intensity of illness or inability to take PO and Inpatient level of care appropriate due to severity of illness  Dispo: The patient is from: Home              Anticipated d/c is to: SNF              Patient currently is not medically stable to d/c.   Difficult to place patient No        I/O last 3 completed shifts: In: 3960.4 [P.O.:1347; I.V.:1856.7; IV Piggyback:756.7] Out: 385 [Urine:385] Total I/O In: 240 [P.O.:240] Out: 500 [Urine:500]     Consultants:  None  Procedures: None  Antimicrobials: Rocephin and Zithromax  Subjective: Patient doing well today, no hypoxia, no short of breath, cough, nonproductive. Denies any abdominal pain or nausea vomiting. No chest pain palpitation No fever or chills. No dysuria hematuria.  Objective: Vitals:   12/05/20 0446 12/05/20 0600 12/05/20 0900 12/05/20 1137  BP: 133/75 133/75 (!) 127/98 (!) 146/69  Pulse:  68 82 (!) 54  Resp: 18   18  Temp: 97.9 F (36.6 C)  98 F (36.7 C) 97.6 F (36.4 C)  TempSrc: Axillary     SpO2: 98%   98%  Weight: 53.3 kg     Height:        Intake/Output Summary (Last 24 hours) at 12/05/2020 1410 Last data filed at 12/05/2020 1042 Gross per 24 hour  Intake 4200.4 ml  Output 885 ml  Net 3315.4 ml   Filed Weights   12/03/20 2101 12/04/20 1859 12/05/20 0446  Weight: 60 kg 53.7 kg 53.3 kg    Examination:  General exam: Appears calm and comfortable, severely malnourished Respiratory system: Decreased breathing sounds.Marland Kitchen Respiratory effort normal. Cardiovascular system: S1 & S2 heard, RRR. No JVD, murmurs, rubs, gallops  or clicks. No pedal edema. Gastrointestinal system: Abdomen is nondistended, soft and nontender. No organomegaly or masses felt. Normal bowel sounds heard. Central nervous system: Alert and oriented. No focal neurological deficits. Extremities: Symmetric 5 x 5 power. Skin: No rashes, lesions or ulcers Psychiatry: Judgement and insight appear normal. Mood & affect appropriate.     Data Reviewed: I have personally reviewed following labs and imaging studies  CBC: Recent Labs  Lab 12/03/20 1122 12/04/20 0329 12/05/20 0611  WBC 11.9* 7.9 7.9  NEUTROABS 10.6*  --   --   HGB 13.9 11.2* 11.9*  HCT 45.2 35.3* 38.8*  MCV 85.1 84.7 85.5  PLT 490* 325 376   Basic Metabolic Panel: Recent Labs  Lab 12/03/20 1122 12/03/20 1418 12/04/20 0329 12/05/20 0611  NA 133*  --  138 138  K 4.3  --  4.2 3.8  CL 98  --  110 108  CO2 18*  --  21* 24  GLUCOSE 115*  --  92 113*  BUN 37*  --  39* 30*  CREATININE 1.45*  --  1.27* 1.07  CALCIUM 9.8  --  8.3* 9.2  MG  --  2.1  --   --    GFR: Estimated Creatinine Clearance: 42.2 mL/min (by C-G formula based on SCr of 1.07 mg/dL). Liver Function Tests: Recent Labs  Lab 12/03/20 1122  AST 47*  ALT 16  ALKPHOS 83  BILITOT 1.6*  PROT 6.9  ALBUMIN 3.6   No results for input(s): LIPASE, AMYLASE in the last 168 hours. No results for input(s): AMMONIA in the last 168 hours. Coagulation Profile: Recent Labs  Lab 12/03/20 1418  INR 1.1   Cardiac Enzymes: No results for input(s): CKTOTAL, CKMB, CKMBINDEX, TROPONINI in the last 168 hours. BNP (last 3 results) No results for input(s): PROBNP in the last 8760 hours. HbA1C: No results for input(s): HGBA1C in the last 72 hours. CBG: Recent Labs  Lab 12/03/20 1141  GLUCAP 118*   Lipid Profile: Recent Labs    12/04/20 0329  CHOL 87  HDL 48  LDLCALC 30  TRIG 43  CHOLHDL 1.8   Thyroid Function Tests: No results for input(s): TSH, T4TOTAL, FREET4, T3FREE, THYROIDAB in the last 72  hours. Anemia Panel: No results for input(s): VITAMINB12, FOLATE, FERRITIN, TIBC, IRON, RETICCTPCT in the last 72 hours. Sepsis Labs: Recent Labs  Lab 12/03/20 1418 12/03/20 1720 12/03/20 2105 12/04/20 0105  PROCALCITON 0.58  --   --   --   LATICACIDVEN 2.0* 2.0* 1.2 1.1    Recent Results (from the past 240 hour(s))  Resp Panel by RT-PCR (Flu A&B, Covid) Nasopharyngeal Swab     Status: None   Collection Time: 12/03/20 11:33 AM   Specimen: Nasopharyngeal Swab; Nasopharyngeal(NP) swabs in vial transport medium  Result Value Ref Range Status   SARS Coronavirus 2 by RT PCR NEGATIVE NEGATIVE Final    Comment: (NOTE) SARS-CoV-2 target nucleic acids are NOT DETECTED.  The SARS-CoV-2 RNA is generally detectable in upper respiratory specimens during the acute phase of infection. The lowest concentration of SARS-CoV-2 viral copies this assay can detect is  138 copies/mL. A negative result does not preclude SARS-Cov-2 infection and should not be used as the sole basis for treatment or other patient management decisions. A negative result may occur with  improper specimen collection/handling, submission of specimen other than nasopharyngeal swab, presence of viral mutation(s) within the areas targeted by this assay, and inadequate number of viral copies(<138 copies/mL). A negative result must be combined with clinical observations, patient history, and epidemiological information. The expected result is Negative.  Fact Sheet for Patients:  BloggerCourse.com  Fact Sheet for Healthcare Providers:  SeriousBroker.it  This test is no t yet approved or cleared by the [place] FDA and  has been authorized for detection and/or diagnosis of SARS-CoV-2 by FDA under an Emergency Use Authorization (EUA). This EUA will remain  in effect (meaning this test can be used) for the duration of the COVID-19 declaration under Section 564(b)(1) of the  Act, 21 U.S.C.section 360bbb-3(b)(1), unless the authorization is terminated  or revoked sooner.       Influenza A by PCR NEGATIVE NEGATIVE Final   Influenza B by PCR NEGATIVE NEGATIVE Final    Comment: (NOTE) The Xpert Xpress SARS-CoV-2/FLU/RSV plus assay is intended as an aid in the diagnosis of influenza from Nasopharyngeal swab specimens and should not be used as a sole basis for treatment. Nasal washings and aspirates are unacceptable for Xpert Xpress SARS-CoV-2/FLU/RSV testing.  Fact Sheet for Patients: BloggerCourse.com  Fact Sheet for Healthcare Providers: SeriousBroker.it  This test is not yet approved or cleared by the [place] FDA and has been authorized for detection and/or diagnosis of SARS-CoV-2 by FDA under an Emergency Use Authorization (EUA). This EUA will remain in effect (meaning this test can be used) for the duration of the COVID-19 declaration under Section 564(b)(1) of the Act, 21 U.S.C. section 360bbb-3(b)(1), unless the authorization is terminated or revoked.  Performed at [facility], [address] [place], [place] [address]   Culture, blood (x 2)     Status: None (Preliminary result)   Collection Time: 12/03/20  2:26 PM   Specimen: BLOOD  Result Value Ref Range Status   Specimen Description BLOOD LEFT ANTECUBITAL  Final   Special Requests   Final    BOTTLES DRAWN AEROBIC AND ANAEROBIC Blood Culture adequate volume   Culture   Final    NO GROWTH 2 DAYS Performed at [facility], [address], [place], [place] [address]    Report Status PENDING  Incomplete  Culture, blood (x 2)     Status: None (Preliminary result)   Collection Time: 12/03/20  2:27 PM   Specimen: BLOOD  Result Value Ref Range Status   Specimen Description BLOOD RIGHT ANTECUBITAL  Final   Special Requests   Final    BOTTLES DRAWN AEROBIC AND ANAEROBIC Blood Culture adequate volume   Culture   Final     NO GROWTH 2 DAYS Performed at [facility], [address] [place], [place] [address]    Report Status PENDING  Incomplete  Urine Culture     Status: None   Collection Time: 12/04/20  1:06 AM   Specimen: Urine, Random  Result Value Ref Range Status   Specimen Description   Final    URINE, RANDOM Performed at [facility], [address] [place], [place] [address]    Special Requests   Final    Normal Performed at [facility], [address], [place], [place] [address]    Culture   Final  NO GROWTH Performed at [facility], [address], [place], [place] [address]    Report Status 12/04/2020 FINAL  Final         Radiology Studies: CT Angio Chest Pulmonary Embolism (PE) W or WO Contrast  Result Date: 12/03/2020 CLINICAL DATA:  Hypoxia.  Elevated D-dimer level.  Weight loss. EXAM: CT ANGIOGRAPHY CHEST WITH CONTRAST TECHNIQUE: Multidetector CT imaging of the chest was performed using the standard protocol during bolus administration of intravenous contrast. Multiplanar CT image reconstructions and MIPs were obtained to evaluate the vascular anatomy. CONTRAST:  67mL OMNIPAQUE IOHEXOL 350 MG/ML SOLN COMPARISON:  Multiple exams, including 05/17/2017 FINDINGS: Despite efforts by the technologist and patient, motion artifact is present on today's exam and could not be eliminated. This reduces exam sensitivity and specificity. Cardiovascular: No filling defect is identified in the pulmonary arterial tree to suggest pulmonary embolus. Ascending thoracic aortic aneurysm 5.4 cm in diameter on image 248 series 5, previously the same on 05/17/2017. Contrast medium was timed for pulmonary arterial opacification and the systemic vasculature is poorly opacified. Coronary, aortic arch, and branch vessel atherosclerotic vascular disease. Aortic and mitral valve calcifications noted. Moderate cardiomegaly in particular with right atrial enlargement.  Mediastinum/Nodes: Borderline dilated gas-filled midthoracic esophagus. Lungs/Pleura: Centrilobular emphysema. Mild biapical pleuroparenchymal scarring. Plugging with complete occlusion of the right lower lobe pulmonary artery with scattered nodularity and peripheral atelectasis in the right lower lobe distal to the airway plugging. There is also some airway thickening and bronchiectasis posteriorly in the right upper lobe. Small right pleural effusion observed with dense pleural calcification on the right side similar to the prior exam. Upper Abdomen: Substantial atherosclerosis of visualized upper abdominal vessels. This includes the abdominal aorta. Musculoskeletal: Degenerative glenohumeral arthropathy bilaterally. Review of the MIP images confirms the above findings. IMPRESSION: 1. No filling defect is identified in the pulmonary arterial tree to suggest pulmonary embolus. 2. Ascending thoracic aortic aneurysm 5.4 cm in diameter, not appreciably changed from 05/17/2017. Ascending thoracic aortic aneurysm. Recommend semi-annual imaging followup by CTA or MRA and referral to cardiothoracic surgery if not already obtained. This recommendation follows 2010 ACCF/AHA/AATS/ACR/ASA/SCA/SCAI/SIR/STS/SVM Guidelines for the Diagnosis and Management of Patients With Thoracic Aortic Disease. Circulation. 2010; 121: Z025-E527. Aortic aneurysm NOS (ICD10-I71.9) 3. Aortic Atherosclerosis (ICD10-I70.0). Coronary atherosclerosis with aortic and mitral valve calcifications along with cardiomegaly particularly involving the right atrium. 4.  Emphysema (ICD10-J43.9). 5. Complete plugging of the right lower lobe bronchus, with nodularity in the right lower lobe which is likely inflammatory/infectious, as well as some airspace opacity in the right lower lobe which may reflect pneumonia or atelectasis. 6. Small right pleural effusion, containing a densely 2.1 cm calcified structure similar to the prior exam. 7. Mild bronchiectasis and  airway thickening posteriorly in the right upper lobe. Electronically Signed   By: Gaylyn Rong M.D.   On: 12/03/2020 17:59   ECHOCARDIOGRAM COMPLETE  Result Date: 12/05/2020    ECHOCARDIOGRAM REPORT   Patient Name:   Austin Zamora Date of Exam: 12/04/2020 Medical Rec #:  [id number]     Height:       72.0 in Accession #:    [id number]    Weight:       118.3 lb Date of Birth:  [birth year]      BSA:          1.704 m Patient Age:    80 years      BP:           122/77 mmHg Patient  Gender: M             HR:           71 bpm. Exam Location:  [facility] Procedure: 2D Echo, Cardiac Doppler and Color Doppler Indications:     I35.0 Aortic Stenosis  History:         Patient has prior history of Echocardiogram examinations, most                  recent 05/17/2017. Arrythmias:Atrial Fibrillation; Risk                  Factors:Hypertension and Alcohol abuse. Ascendind Aortic                  Aneurysm. Chronic systolic heart failure.  Sonographer:     Daphine Deutscher RDCS Referring Phys:  [id number] AMBER SCOGGINS Diagnosing Phys: Adrian Blackwater IMPRESSIONS  1. Left ventricular ejection fraction, by estimation, is 45 to 50%. The left ventricle has mild to moderately decreased function. The left ventricle demonstrates global hypokinesis. The left ventricular internal cavity size was moderately dilated. There  is moderate concentric left ventricular hypertrophy. Left ventricular diastolic parameters are consistent with Grade I diastolic dysfunction (impaired relaxation).  2. Right ventricular systolic function is severely reduced. The right ventricular size is moderately enlarged. Moderately increased right ventricular wall thickness.  3. Left atrial size was severely dilated.  4. Right atrial size was severely dilated.  5. The mitral valve is degenerative. Mild mitral valve regurgitation. No evidence of mitral stenosis. Severe mitral annular calcification.  6. Tricuspid valve regurgitation is severe.  7. The aortic valve is  calcified. Aortic valve regurgitation is mild to moderate. Severe aortic valve stenosis.  8. The inferior vena cava is normal in size with greater than 50% respiratory variability, suggesting right atrial pressure of 3 mmHg. Conclusion(s)/Recommendation(s): Valvular findings as outlined below. FINDINGS  Left Ventricle: Left ventricular ejection fraction, by estimation, is 45 to 50%. The left ventricle has mild to moderately decreased function. The left ventricle demonstrates global hypokinesis. The left ventricular internal cavity size was moderately dilated. There is moderate concentric left ventricular hypertrophy. Left ventricular diastolic parameters are consistent with Grade I diastolic dysfunction (impaired relaxation). Right Ventricle: The right ventricular size is moderately enlarged. Moderately increased right ventricular wall thickness. Right ventricular systolic function is severely reduced. Left Atrium: Left atrial size was severely dilated. Right Atrium: Right atrial size was severely dilated. Pericardium: There is no evidence of pericardial effusion. Mitral Valve: The mitral valve is degenerative in appearance. Severe mitral annular calcification. Mild mitral valve regurgitation. No evidence of mitral valve stenosis. Tricuspid Valve: The tricuspid valve is normal in structure. Tricuspid valve regurgitation is severe. No evidence of tricuspid stenosis. Aortic Valve: The aortic valve is calcified. Aortic valve regurgitation is mild to moderate. Aortic regurgitation PHT measures 582 msec. Severe aortic stenosis is present. Aortic valve mean gradient measures 28.0 mmHg. Aortic valve peak gradient measures  45.3 mmHg. Aortic valve area, by VTI measures 0.55 cm. Pulmonic Valve: The pulmonic valve was normal in structure. Pulmonic valve regurgitation is mild. No evidence of pulmonic stenosis. Aorta: The aortic root is normal in size and structure. Venous: The inferior vena cava is normal in size with greater  than 50% respiratory variability, suggesting right atrial pressure of 3 mmHg. IAS/Shunts: No atrial level shunt detected by color flow Doppler.  LEFT VENTRICLE PLAX 2D LVIDd:         4.51 cm LVIDs:  3.48 cm LV PW:         0.97 cm LV IVS:        0.98 cm LVOT diam:     1.80 cm LV SV:         40 LV SV Index:   24 LVOT Area:     2.54 cm  RIGHT VENTRICLE            IVC RV Basal diam:  4.80 cm    IVC diam: 2.85 cm RV S prime:     9.36 cm/s TAPSE (M-mode): 1.4 cm LEFT ATRIUM             Index       RIGHT ATRIUM           Index LA diam:        5.10 cm 2.99 cm/m  RA Area:     32.00 cm LA Vol (A2C):   84.6 ml 49.64 ml/m RA Volume:   127.00 ml 74.51 ml/m LA Vol (A4C):   79.2 ml 46.47 ml/m LA Biplane Vol: 82.6 ml 48.46 ml/m  AORTIC VALVE AV Area (Vmax):    0.54 cm AV Area (Vmean):   0.51 cm AV Area (VTI):     0.55 cm AV Vmax:           336.40 cm/s AV Vmean:          248.000 cm/s AV VTI:            0.733 m AV Peak Grad:      45.3 mmHg AV Mean Grad:      28.0 mmHg LVOT Vmax:         71.65 cm/s LVOT Vmean:        49.650 cm/s LVOT VTI:          0.158 m LVOT/AV VTI ratio: 0.21 AI PHT:            582 msec  AORTA Ao Root diam: 3.90 cm MV E velocity: 86.03 cm/s  TRICUSPID VALVE                            TR Peak grad:   30.0 mmHg                            TR Vmax:        274.00 cm/s                             SHUNTS                            Systemic VTI:  0.16 m                            Systemic Diam: 1.80 cm Adrian Blackwater Electronically signed by Adrian Blackwater Signature Date/Time: 12/05/2020/8:19:17 AM    Final         Scheduled Meds:  aspirin EC  81 mg Oral Daily   enoxaparin (LOVENOX) injection  40 mg Subcutaneous Q24H   folic acid  1 mg Oral Daily   multivitamin with minerals  1 tablet Oral Daily   thiamine injection  100 mg Intravenous Daily   Continuous Infusions:  sodium chloride 75 mL/hr at 12/05/20 0905   azithromycin 500 mg (12/04/20  1635)   cefTRIAXone (ROCEPHIN)  IV 2 g (12/04/20 1541)      LOS: 2 days    Time spent: 27 minutes    Marrion Coy, MD [facility]   To contact the attending provider between 7A-7P or the covering provider during after hours 7P-7A, please log into the web site [email or web address] and access using universal [place] password for that web site. If you do not have the password, please call the hospital operator.  12/05/2020, 2:10 PM

## 2020-12-06 DIAGNOSIS — J9601 Acute respiratory failure with hypoxia: Secondary | ICD-10-CM | POA: Diagnosis not present

## 2020-12-06 DIAGNOSIS — F1010 Alcohol abuse, uncomplicated: Secondary | ICD-10-CM | POA: Diagnosis not present

## 2020-12-06 DIAGNOSIS — Z7189 Other specified counseling: Secondary | ICD-10-CM | POA: Diagnosis not present

## 2020-12-06 DIAGNOSIS — J159 Unspecified bacterial pneumonia: Secondary | ICD-10-CM | POA: Diagnosis not present

## 2020-12-06 MED ORDER — LABETALOL HCL 5 MG/ML IV SOLN: 10.0000 mg | INTRAVENOUS | Status: DC | PRN

## 2020-12-06 MED ORDER — AMOXICILLIN-POT CLAVULANATE 875-125 MG PO TABS
1.0000 | ORAL_TABLET | Freq: Two times a day (BID) | ORAL | Status: DC
  Administered 2020-12-06 – 2020-12-07 (×3): 1 via ORAL
  Filled 2020-12-06 (×3): qty 1

## 2020-12-06 NOTE — Progress Notes (Signed)
[facility] [address] [employer], [employer] [facility]) Hospital Liaison RN note  Received request from [facility] for hospice services at home after discharge. Chart and patient information under review by Hospice physician. Hospice eligibility pending at this time.  Spoke with patient's son Minerva Areola to initiate education related to hospice philosophy, services, and team approach to care. Patient/family verbalized understanding of information given. Per discussion, the plan is for discharge home by private vehicle on Friday 10.7.2022.  DME needs discussed. Patient has no immediate DME needs.   Please send signed and completed DNR with patient/family. Please provide symptoms at discharge as needed for ongoing symptom management.   [facility] information and contact numbers given to [facility].  Above information shared with Clarisse Gouge, [facility] Please call with any hospice related questions or concerns.  Thank you for the opportunity to participate in this patient's care.  Thea Gist, Charity fundraiser, BSN [employer] [phone]

## 2020-12-06 NOTE — Care Management Important Message (Signed)
Important Message  Patient Details  Name: Austin Zamora MRN: [id number] Date of Birth: January 09, 1941   Medicare Important Message Given:  Other (see comment)  Disposition to discharge with hospice services.  Medicare IM withheld at this time.   Johnell Comings 12/06/2020, 3:17 PM

## 2020-12-06 NOTE — Progress Notes (Addendum)
Daily Progress Note   Patient Name: Austin Zamora       Date: 12/06/2020 DOB: [birth year]  Age: 80 y.o. MRN#: [id number] Attending Physician: Sharen Hones, MD Primary Care Physician: Yolonda Kida, MD Admit Date: 12/03/2020  Reason for Consultation/Follow-up: Establishing goals of care  Subjective: Patient is resting in bed with son and brother at bedside. He denies symptoms. He is clear he wants to go home and do things on his terms until he dies, and does not want to return to the hospital. His son discusses that he is aware his father wants to "go home and do things on his terms". He discusses his decline over the past few years and noted the change when patient's daughter died.   We discussed his diagnoses, prognosis, GOC, EOL wishes disposition and options.  Created space and opportunity for patient  to explore thoughts and feelings regarding current medical information.   A detailed discussion was had today regarding advanced directives.  Concepts specific to code status, artifical feeding and hydration, IV antibiotics and rehospitalization were discussed.  The difference between an aggressive medical intervention path and a comfort care path was discussed.  Values and goals of care important to patient and family were attempted to be elicited.  Discussed limitations of medical interventions to prolong quality of life in some situations and discussed the concept of human mortality. Discussed acceptable QOL.   Plans to have patient go home with hospice.  I completed a MOST form today with patient. Brother and son present. The signed original was placed in the chart. A photocopy was placed in the chart to be scanned into EMR. The patient outlined their wishes for the following treatment  decisions:  Cardiopulmonary Resuscitation: Do Not Attempt Resuscitation (DNR/No CPR)  Medical Interventions: Comfort Measures: Keep clean, warm, and dry. Use medication by any route, positioning, wound care, and other measures to relieve pain and suffering. Use oxygen, suction and manual treatment of airway obstruction as needed for comfort. Do not transfer to the hospital unless comfort needs cannot be met in current location.  Antibiotics: Determine use of limitation of antibiotics when infection occurs  IV Fluids: No IV fluids (provide other measures to ensure comfort)  Feeding Tube: No feeding tube     Length of Stay: 3  Current Medications: Scheduled Meds:  .  aspirin EC  81 mg Oral Daily  . enoxaparin (LOVENOX) injection  40 mg Subcutaneous Q24H  . feeding supplement  237 mL Oral TID BM  . folic acid  1 mg Oral Daily  . multivitamin with minerals  1 tablet Oral Daily  . thiamine injection  100 mg Intravenous Daily    Continuous Infusions: . azithromycin 500 mg (12/05/20 1542)  . cefTRIAXone (ROCEPHIN)  IV 2 g (12/05/20 1454)    PRN Meds: acetaminophen **OR** acetaminophen, bisacodyl, guaiFENesin-dextromethorphan, labetalol, LORazepam **OR** LORazepam, morphine injection, ondansetron **OR** ondansetron (ZOFRAN) IV, oxyCODONE, senna-docusate  Physical Exam Pulmonary:     Effort: Pulmonary effort is normal.  Neurological:     Mental Status: He is alert.            Vital Signs: BP (!) 155/71 (BP Location: Left Arm)   Pulse 89   Temp 97.8 F (36.6 C)   Resp 14   Ht 6' (1.829 m)   Wt 54.9 kg   SpO2 100%   BMI 16.41 kg/m  SpO2: SpO2: 100 % O2 Device: O2 Device: Room Air O2 Flow Rate: O2 Flow Rate (L/min): 3 L/min  Intake/output summary:  Intake/Output Summary (Last 24 hours) at 12/06/2020 1127 Last data filed at 12/06/2020 0940 Gross per 24 hour  Intake 964.14 ml  Output 125 ml  Net 839.14 ml   LBM: Last BM Date: 12/03/20 Baseline Weight: Weight: 60 kg Most  recent weight: Weight: 54.9 kg    Patient Active Problem List   Diagnosis Date Noted  . Reactive thrombocytosis 12/05/2020  . Acute respiratory failure with hypoxia ([place]) 12/03/2020  . Bacterial pneumonia 12/03/2020  . Sepsis due to undetermined organism ([place]) 12/03/2020  . Pre-syncope 12/03/2020  . Troponin I above reference range 12/03/2020  . Elevated liver function tests 12/03/2020  . Pressure injury of skin 01/30/2018  . Chronic systolic CHF (congestive heart failure) ([place]) 06/10/2017  . Thoracic aortic aneurysm without rupture 05/27/2017  . Benign essential HTN 05/27/2017  . Acute renal failure (ARF) ([place])   . Acute kidney injury ([place])   . Nonrheumatic aortic valve stenosis   . Influenza A 05/17/2017  . Protein-calorie malnutrition, severe 05/17/2017  . Paroxysmal A-fib ([place]) 04/06/2017  . SOBOE (shortness of breath on exertion) 04/06/2017  . Alcohol abuse 10/16/2014  . Moderate aortic valve stenosis 09/06/2014    Palliative Care Assessment & Plan    Recommendations/Plan: Home with hospice. Would like [facility] in the case he needs hospice facility placement in the future.     Code Status:    Code Status Orders  (From admission, onward)           Start     Ordered   12/03/20 1843  Full code  Continuous        12/03/20 1846           Code Status History     Date Active Date Inactive Code Status Order ID Comments User Context   01/29/2018 1820 02/06/2018 1632 DNR [id number]  Demetrios Loll, MD Inpatient   09/22/2017 1408 09/22/2017 1931 Full Code [id number]  Corey Skains, MD Inpatient   05/17/2017 0644 05/18/2017 2149 Full Code [id number]  Gorden Harms, MD Inpatient   09/06/2014 0429 09/06/2014 1620 Full Code [id number]  Harrie Foreman, MD Inpatient       Prognosis:  < 6 months    Care plan was discussed with primary MD  Thank you for allowing the Palliative Medicine Team to assist in the  care of this patient.       Total Time 35 min  Prolonged Time Billed  no       Greater than 50%  of this time was spent counseling and coordinating care related to the above assessment and plan.  Asencion Gowda, NP  Please contact Palliative Medicine Team phone at [phone] for questions and concerns.

## 2020-12-06 NOTE — Progress Notes (Signed)
PROGRESS NOTE    Austin Zamora  MOQ:[id number] DOB: 04-Jun-1940 DOA: 12/03/2020 PCP: Alwyn Pea, MD    Brief Narrative:  80 y.o. male with medical history significant for aortic stenosis, chronic systolic CHF EF 20% by 2019 echocardiogram, mitral regurgitation, paroxysmal atrial fibrillation not on anticoagulation due to history of diverticular bleed 2019, thoracic aortic aneurysm, hypertension, severe malnutrition, alcohol abuse, who was prescribed medications but not currently taking any admitted on 12/03/2020 with episode of collapsing / slumping over in a store.      Assessment & Plan:   Principal Problem:   Acute respiratory failure with hypoxia (HCC) Active Problems:   Moderate aortic valve stenosis   Protein-calorie malnutrition, severe   Alcohol abuse   Paroxysmal A-fib (HCC)   Acute kidney injury (HCC)   Chronic systolic CHF (congestive heart failure) (HCC)   Thoracic aortic aneurysm without rupture   Bacterial pneumonia   Sepsis due to undetermined organism (HCC)   Pre-syncope   Troponin I above reference range   Elevated liver function tests   Reactive thrombocytosis  Acute respiratory failure with hypoxemia secondary to pneumonia. Aspiration pneumonia. Sepsis is secondary to pneumonia. Elevated troponin secondary to acute hypoxemia. Condition improving, will complete 5 days antibiotics.  Patient probably will be discharged home tomorrow, will change antibiotics today to Augmentin.  Presyncope secondary to moderate aortic valve stenosis. Moderate aortic valve stenosis. Chronic systolic congestive heart failure.   Paroxysmal atrial fibrillation. Discussed with cardiology, no heart catheter planned this time due to patient going to home with hospice.  Alcohol abuse. No evidence of withdrawal.  Discussed with the palliative care, after long discussion with the family and the patient, decision is made for home with hospice.  We will discharge tomorrow.      DVT prophylaxis: Lovenox Code Status: full Family Communication:  Disposition Plan:      Status is: Inpatient   Remains inpatient appropriate because:IV treatments appropriate due to intensity of illness or inability to take PO and Inpatient level of care appropriate due to severity of illness   Dispo: The patient is from: Home              Anticipated d/c is to: With hospice              Patient currently is not medically stable to d/c.              Difficult to place patient No        I/O last 3 completed shifts: In: 1982.8 [P.O.:597; I.V.:1385.8] Out: 835 [Urine:835] Total I/O In: 360 [P.O.:360] Out: -      Consultants:  Cardiology, palliative care.   Procedures: None   Antimicrobials: Augmentin    Subjective: Feels well today, short of breath has much improved.  Off oxygen. No abdominal pain nausea vomiting  No dysuria hematuria. No fever chills  Objective: Vitals:   12/06/20 0407 12/06/20 0519 12/06/20 0736 12/06/20 1143  BP: (!) 139/54  (!) 155/71 (!) 153/70  Pulse: 75  89 63  Resp: 17  14 20   Temp: 98.2 F (36.8 C)  97.8 F (36.6 C) 98 F (36.7 C)  TempSrc:      SpO2: 97%  100% 99%  Weight:  54.9 kg    Height:        Intake/Output Summary (Last 24 hours) at 12/06/2020 1206 Last data filed at 12/06/2020 0940 Gross per 24 hour  Intake 964.14 ml  Output 125 ml  Net 839.14 ml  Filed Weights   12/04/20 1859 12/05/20 0446 12/06/20 0519  Weight: 53.7 kg 53.3 kg 54.9 kg    Examination:  General exam: Appears calm and comfortable  Respiratory system: Decreased breathing sounds. Respiratory effort normal. Cardiovascular system: S1 & S2 heard, RRR. No JVD, murmurs, rubs, gallops or clicks. No pedal edema. Gastrointestinal system: Abdomen is nondistended, soft and nontender. No organomegaly or masses felt. Normal bowel sounds heard. Central nervous system: Alert and oriented x3. No focal neurological deficits. Extremities: Symmetric 5 x 5  power. Skin: No rashes, lesions or ulcers Psychiatry: Judgement and insight appear normal. Mood & affect appropriate.     Data Reviewed: I have personally reviewed following labs and imaging studies  CBC: Recent Labs  Lab 12/03/20 1122 12/04/20 0329 12/05/20 0611  WBC 11.9* 7.9 7.9  NEUTROABS 10.6*  --   --   HGB 13.9 11.2* 11.9*  HCT 45.2 35.3* 38.8*  MCV 85.1 84.7 85.5  PLT 490* 325 376   Basic Metabolic Panel: Recent Labs  Lab 12/03/20 1122 12/03/20 1418 12/04/20 0329 12/05/20 0611  NA 133*  --  138 138  K 4.3  --  4.2 3.8  CL 98  --  110 108  CO2 18*  --  21* 24  GLUCOSE 115*  --  92 113*  BUN 37*  --  39* 30*  CREATININE 1.45*  --  1.27* 1.07  CALCIUM 9.8  --  8.3* 9.2  MG  --  2.1  --   --    GFR: Estimated Creatinine Clearance: 43.5 mL/min (by C-G formula based on SCr of 1.07 mg/dL). Liver Function Tests: Recent Labs  Lab 12/03/20 1122  AST 47*  ALT 16  ALKPHOS 83  BILITOT 1.6*  PROT 6.9  ALBUMIN 3.6   No results for input(s): LIPASE, AMYLASE in the last 168 hours. No results for input(s): AMMONIA in the last 168 hours. Coagulation Profile: Recent Labs  Lab 12/03/20 1418  INR 1.1   Cardiac Enzymes: No results for input(s): CKTOTAL, CKMB, CKMBINDEX, TROPONINI in the last 168 hours. BNP (last 3 results) No results for input(s): PROBNP in the last 8760 hours. HbA1C: No results for input(s): HGBA1C in the last 72 hours. CBG: Recent Labs  Lab 12/03/20 1141  GLUCAP 118*   Lipid Profile: Recent Labs    12/04/20 0329  CHOL 87  HDL 48  LDLCALC 30  TRIG 43  CHOLHDL 1.8   Thyroid Function Tests: No results for input(s): TSH, T4TOTAL, FREET4, T3FREE, THYROIDAB in the last 72 hours. Anemia Panel: No results for input(s): VITAMINB12, FOLATE, FERRITIN, TIBC, IRON, RETICCTPCT in the last 72 hours. Sepsis Labs: Recent Labs  Lab 12/03/20 1418 12/03/20 1720 12/03/20 2105 12/04/20 0105  PROCALCITON 0.58  --   --   --   LATICACIDVEN 2.0*  2.0* 1.2 1.1    Recent Results (from the past 240 hour(s))  Resp Panel by RT-PCR (Flu A&B, Covid) Nasopharyngeal Swab     Status: None   Collection Time: 12/03/20 11:33 AM   Specimen: Nasopharyngeal Swab; Nasopharyngeal(NP) swabs in vial transport medium  Result Value Ref Range Status   SARS Coronavirus 2 by RT PCR NEGATIVE NEGATIVE Final    Comment: (NOTE) SARS-CoV-2 target nucleic acids are NOT DETECTED.  The SARS-CoV-2 RNA is generally detectable in upper respiratory specimens during the acute phase of infection. The lowest concentration of SARS-CoV-2 viral copies this assay can detect is 138 copies/mL. A negative result does not preclude SARS-Cov-2 infection and should not  be used as the sole basis for treatment or other patient management decisions. A negative result may occur with  improper specimen collection/handling, submission of specimen other than nasopharyngeal swab, presence of viral mutation(s) within the areas targeted by this assay, and inadequate number of viral copies(<138 copies/mL). A negative result must be combined with clinical observations, patient history, and epidemiological information. The expected result is Negative.  Fact Sheet for Patients:  BloggerCourse.com  Fact Sheet for Healthcare Providers:  SeriousBroker.it  This test is no t yet approved or cleared by the [place] FDA and  has been authorized for detection and/or diagnosis of SARS-CoV-2 by FDA under an Emergency Use Authorization (EUA). This EUA will remain  in effect (meaning this test can be used) for the duration of the COVID-19 declaration under Section 564(b)(1) of the Act, 21 U.S.C.section 360bbb-3(b)(1), unless the authorization is terminated  or revoked sooner.       Influenza A by PCR NEGATIVE NEGATIVE Final   Influenza B by PCR NEGATIVE NEGATIVE Final    Comment: (NOTE) The Xpert Xpress SARS-CoV-2/FLU/RSV plus assay is  intended as an aid in the diagnosis of influenza from Nasopharyngeal swab specimens and should not be used as a sole basis for treatment. Nasal washings and aspirates are unacceptable for Xpert Xpress SARS-CoV-2/FLU/RSV testing.  Fact Sheet for Patients: BloggerCourse.com  Fact Sheet for Healthcare Providers: SeriousBroker.it  This test is not yet approved or cleared by the [place] FDA and has been authorized for detection and/or diagnosis of SARS-CoV-2 by FDA under an Emergency Use Authorization (EUA). This EUA will remain in effect (meaning this test can be used) for the duration of the COVID-19 declaration under Section 564(b)(1) of the Act, 21 U.S.C. section 360bbb-3(b)(1), unless the authorization is terminated or revoked.  Performed at [facility], [address] [place], [place] [address]   Culture, blood (x 2)     Status: None (Preliminary result)   Collection Time: 12/03/20  2:26 PM   Specimen: BLOOD  Result Value Ref Range Status   Specimen Description BLOOD LEFT ANTECUBITAL  Final   Special Requests   Final    BOTTLES DRAWN AEROBIC AND ANAEROBIC Blood Culture adequate volume   Culture   Final    NO GROWTH 3 DAYS Performed at [facility], [address], [place], [place] [address]    Report Status PENDING  Incomplete  Culture, blood (x 2)     Status: None (Preliminary result)   Collection Time: 12/03/20  2:27 PM   Specimen: BLOOD  Result Value Ref Range Status   Specimen Description BLOOD RIGHT ANTECUBITAL  Final   Special Requests   Final    BOTTLES DRAWN AEROBIC AND ANAEROBIC Blood Culture adequate volume   Culture   Final    NO GROWTH 3 DAYS Performed at [facility], [address], [place], [place] [address]    Report Status PENDING  Incomplete  Urine Culture     Status: None   Collection Time: 12/04/20  1:06 AM   Specimen: Urine, Random  Result Value Ref Range  Status   Specimen Description   Final    URINE, RANDOM Performed at [facility], [address] [place], [place] [address]    Special Requests   Final    Normal Performed at [facility], [address], [place], [place] [address]    Culture   Final    NO GROWTH Performed at [facility], [address] [place], [place] [address]    Report Status 12/04/2020 FINAL  Final         Radiology Studies: ECHOCARDIOGRAM COMPLETE  Result Date: 12/05/2020    ECHOCARDIOGRAM REPORT   Patient Name:   KRESTON AHRENDT Date of Exam: 12/04/2020 Medical Rec #:  [id number]     Height:       72.0 in Accession #:    [id number]    Weight:       118.3 lb Date of Birth:  [birth year]      BSA:          1.704 m Patient Age:    80 years      BP:           122/77 mmHg Patient Gender: M             HR:           71 bpm. Exam Location:  [facility] Procedure: 2D Echo, Cardiac Doppler and Color Doppler Indications:     I35.0 Aortic Stenosis  History:         Patient has prior history of Echocardiogram examinations, most                  recent 05/17/2017. Arrythmias:Atrial Fibrillation; Risk                  Factors:Hypertension and Alcohol abuse. Ascendind Aortic                  Aneurysm. Chronic systolic heart failure.  Sonographer:     Daphine Deutscher RDCS Referring Phys:  [id number] AMBER SCOGGINS Diagnosing Phys: Adrian Blackwater IMPRESSIONS  1. Left ventricular ejection fraction, by estimation, is 45 to 50%. The left ventricle has mild to moderately decreased function. The left ventricle demonstrates global hypokinesis. The left ventricular internal cavity size was moderately dilated. There  is moderate concentric left ventricular hypertrophy. Left ventricular diastolic parameters are consistent with Grade I diastolic dysfunction (impaired relaxation).  2. Right ventricular systolic function is severely reduced. The right ventricular size is moderately enlarged. Moderately increased right ventricular  wall thickness.  3. Left atrial size was severely dilated.  4. Right atrial size was severely dilated.  5. The mitral valve is degenerative. Mild mitral valve regurgitation. No evidence of mitral stenosis. Severe mitral annular calcification.  6. Tricuspid valve regurgitation is severe.  7. The aortic valve is calcified. Aortic valve regurgitation is mild to moderate. Severe aortic valve stenosis.  8. The inferior vena cava is normal in size with greater than 50% respiratory variability, suggesting right atrial pressure of 3 mmHg. Conclusion(s)/Recommendation(s): Valvular findings as outlined below. FINDINGS  Left Ventricle: Left ventricular ejection fraction, by estimation, is 45 to 50%. The left ventricle has mild to moderately decreased function. The left ventricle demonstrates global hypokinesis. The left ventricular internal cavity size was moderately dilated. There is moderate concentric left ventricular hypertrophy. Left ventricular diastolic parameters are consistent with Grade I diastolic dysfunction (impaired relaxation). Right Ventricle: The right ventricular size is moderately enlarged. Moderately increased right ventricular wall thickness. Right ventricular systolic function is severely reduced. Left Atrium: Left atrial size was severely dilated. Right Atrium: Right atrial size was severely dilated. Pericardium: There is no evidence of pericardial effusion. Mitral Valve: The mitral valve is degenerative in appearance. Severe mitral annular calcification. Mild mitral valve regurgitation. No evidence of mitral valve stenosis. Tricuspid Valve: The tricuspid valve is normal in structure. Tricuspid valve regurgitation is severe. No evidence of tricuspid stenosis.  Aortic Valve: The aortic valve is calcified. Aortic valve regurgitation is mild to moderate. Aortic regurgitation PHT measures 582 msec. Severe aortic stenosis is present. Aortic valve mean gradient measures 28.0 mmHg. Aortic valve peak gradient  measures  45.3 mmHg. Aortic valve area, by VTI measures 0.55 cm. Pulmonic Valve: The pulmonic valve was normal in structure. Pulmonic valve regurgitation is mild. No evidence of pulmonic stenosis. Aorta: The aortic root is normal in size and structure. Venous: The inferior vena cava is normal in size with greater than 50% respiratory variability, suggesting right atrial pressure of 3 mmHg. IAS/Shunts: No atrial level shunt detected by color flow Doppler.  LEFT VENTRICLE PLAX 2D LVIDd:         4.51 cm LVIDs:         3.48 cm LV PW:         0.97 cm LV IVS:        0.98 cm LVOT diam:     1.80 cm LV SV:         40 LV SV Index:   24 LVOT Area:     2.54 cm  RIGHT VENTRICLE            IVC RV Basal diam:  4.80 cm    IVC diam: 2.85 cm RV S prime:     9.36 cm/s TAPSE (M-mode): 1.4 cm LEFT ATRIUM             Index       RIGHT ATRIUM           Index LA diam:        5.10 cm 2.99 cm/m  RA Area:     32.00 cm LA Vol (A2C):   84.6 ml 49.64 ml/m RA Volume:   127.00 ml 74.51 ml/m LA Vol (A4C):   79.2 ml 46.47 ml/m LA Biplane Vol: 82.6 ml 48.46 ml/m  AORTIC VALVE AV Area (Vmax):    0.54 cm AV Area (Vmean):   0.51 cm AV Area (VTI):     0.55 cm AV Vmax:           336.40 cm/s AV Vmean:          248.000 cm/s AV VTI:            0.733 m AV Peak Grad:      45.3 mmHg AV Mean Grad:      28.0 mmHg LVOT Vmax:         71.65 cm/s LVOT Vmean:        49.650 cm/s LVOT VTI:          0.158 m LVOT/AV VTI ratio: 0.21 AI PHT:            582 msec  AORTA Ao Root diam: 3.90 cm MV E velocity: 86.03 cm/s  TRICUSPID VALVE                            TR Peak grad:   30.0 mmHg                            TR Vmax:        274.00 cm/s                             SHUNTS  Systemic VTI:  0.16 m                            Systemic Diam: 1.80 cm Adrian Blackwater Electronically signed by Adrian Blackwater Signature Date/Time: 12/05/2020/8:19:17 AM    Final         Scheduled Meds:  aspirin EC  81 mg Oral Daily   feeding supplement  237 mL Oral  TID BM   folic acid  1 mg Oral Daily   multivitamin with minerals  1 tablet Oral Daily   thiamine injection  100 mg Intravenous Daily   Continuous Infusions:  azithromycin 500 mg (12/05/20 1542)   cefTRIAXone (ROCEPHIN)  IV 2 g (12/05/20 1454)     LOS: 3 days    Time spent: 27 minutes    Marrion Coy, MD [facility]   To contact the attending provider between 7A-7P or the covering provider during after hours 7P-7A, please log into the web site [email or web address] and access using universal [place] password for that web site. If you do not have the password, please call the hospital operator.  12/06/2020, 12:06 PM

## 2020-12-06 NOTE — TOC Progression Note (Signed)
Transition of Care [facility]) - Progression Note    Patient Details  Name: Austin Zamora MRN: [id number] Date of Birth: Jul 20, 1940  [facility] of [facility]) CM/SW Contact  Maree Krabbe, LCSW Phone Number: 12/06/2020, 3:39 PM  Clinical Narrative:   Home Hospice referral made to [facility] with [facility].    Expected Discharge Plan: Home w Home Health Services Barriers to Discharge: Continued Medical Work up  Expected Discharge Plan and Services Expected Discharge Plan: Home w Home Health Services     Post Acute Care Choice: Home Health Living arrangements for the past 2 months: Single Family Home                           HH Arranged: PT, OT, RN, Nurse's Aide, Social Work [employer] Agency:  (referrals sent pending acceptance) Date HH Agency Contacted: 12/05/20       Social Determinants of Health (SDOH) Interventions    Readmission Risk Interventions No flowsheet data found.

## 2020-12-06 NOTE — Progress Notes (Signed)
OT Cancellation Note  Patient Details Name: TREJON DUFORD MRN: [id number] DOB: 15-Mar-1940   Cancelled Treatment:    Reason Eval/Treat Not Completed: Other (comment) Pt transitioned to comfort measures. OT order competed by practitioner. Will sign off. Thank you for involving OT in the care of this patient.  Rejeana Brock, MS, OTR/L ascom [phone] 12/06/20, 1:46 PM

## 2020-12-07 DIAGNOSIS — N179 Acute kidney failure, unspecified: Secondary | ICD-10-CM | POA: Diagnosis not present

## 2020-12-07 DIAGNOSIS — J9601 Acute respiratory failure with hypoxia: Secondary | ICD-10-CM | POA: Diagnosis not present

## 2020-12-07 DIAGNOSIS — F1010 Alcohol abuse, uncomplicated: Secondary | ICD-10-CM | POA: Diagnosis not present

## 2020-12-07 DIAGNOSIS — J159 Unspecified bacterial pneumonia: Secondary | ICD-10-CM | POA: Diagnosis not present

## 2020-12-07 MED ORDER — AMOXICILLIN-POT CLAVULANATE 875-125 MG PO TABS: 1.0000 | ORAL_TABLET | Freq: Two times a day (BID) | ORAL | 0 refills | Status: AC

## 2020-12-07 MED ORDER — PROAIR HFA 108 (90 BASE) MCG/ACT IN AERS: 2.0000 | INHALATION_SPRAY | RESPIRATORY_TRACT | 0 refills | Status: AC | PRN

## 2020-12-07 NOTE — Progress Notes (Signed)
Still waiting for patient's son to arrive to take patient home. Spoke with him at 9am and he said he was coming to pick patient up.

## 2020-12-07 NOTE — Progress Notes (Signed)
Discharge teaching complete. Meds, diet, activity, follow up appointments reviewed and all questions answered. Copy of instructions given to patient and prescription sent to pharmacy. Patient discharged home with son.

## 2020-12-07 NOTE — Progress Notes (Signed)
[facility] [address] [employer] [facility]) Hospital Liaison Note  Hospice eligibility confirmed. No DME needs prior to discharge home.   Please send signed and completed DNR with patient/family at discharge. Please provide prescriptions at discharge as needed for ongoing symptom management.   Please do not hesitate to call with any hospice related questions or concerns.   Thank you,   Bobbie "Einar Gip, RN, BSN [facility] Liaison [phone]

## 2020-12-07 NOTE — Progress Notes (Signed)
SUBJECTIVE:  Austin Zamora is a 80 y.o. male with medical history significant for aortic stenosis, chronic systolic CHF EF 20% by 2019 echocardiogram, mitral regurgitation, paroxysmal atrial fibrillation not on anticoagulation due to history of diverticular bleed 2019, thoracic aortic aneurysm, hypertension, malnutrition, alcohol abuse, who was prescribed medications but not currently taking any, who presented to the emergency department on 12/03/2020 with episode of collapsing / slumping over in a store.   Vitals:   12/06/20 1513 12/06/20 2358 12/07/20 0432 12/07/20 0748  BP: 137/88 (!) 160/87 (!) 171/79 (!) 174/77  Pulse: 72 66 83 79  Resp: 14 19 18 16   Temp: 97.8 F (36.6 C) 97.7 F (36.5 C) 97.9 F (36.6 C) 98.1 F (36.7 C)  TempSrc:  Oral    SpO2: 99% 100% 100% 100%  Weight:   55.4 kg   Height:        Intake/Output Summary (Last 24 hours) at 12/07/2020 0843 Last data filed at 12/06/2020 2358 Gross per 24 hour  Intake 840 ml  Output 375 ml  Net 465 ml    LABS: Basic Metabolic Panel: Recent Labs    12/05/20 0611  NA 138  K 3.8  CL 108  CO2 24  GLUCOSE 113*  BUN 30*  CREATININE 1.07  CALCIUM 9.2   Liver Function Tests: No results for input(s): AST, ALT, ALKPHOS, BILITOT, PROT, ALBUMIN in the last 72 hours. No results for input(s): LIPASE, AMYLASE in the last 72 hours. CBC: Recent Labs    12/05/20 0611  WBC 7.9  HGB 11.9*  HCT 38.8*  MCV 85.5  PLT 376   Cardiac Enzymes: No results for input(s): CKTOTAL, CKMB, CKMBINDEX, TROPONINI in the last 72 hours. BNP: Invalid input(s): POCBNP D-Dimer: No results for input(s): DDIMER in the last 72 hours. Hemoglobin A1C: No results for input(s): HGBA1C in the last 72 hours. Fasting Lipid Panel: No results for input(s): CHOL, HDL, LDLCALC, TRIG, CHOLHDL, LDLDIRECT in the last 72 hours. Thyroid Function Tests: No results for input(s): TSH, T4TOTAL, T3FREE, THYROIDAB in the last 72 hours.  Invalid input(s):  FREET3 Anemia Panel: No results for input(s): VITAMINB12, FOLATE, FERRITIN, TIBC, IRON, RETICCTPCT in the last 72 hours.   PHYSICAL EXAM General: Well developed, well nourished, in no acute distress HEENT:  Normocephalic and atramatic Neck:  No JVD.  Lungs: Clear bilaterally to auscultation and percussion. Heart: HRRR . Normal S1 and S2 without gallops or murmurs.  Abdomen: Bowel sounds are positive, abdomen soft and non-tender  Msk:  Back normal, normal gait. Normal strength and tone for age. Extremities: No clubbing, cyanosis or edema.   Neuro: Alert and oriented X 3. Psych:  Good affect, responds appropriately  TELEMETRY: atrial fibrillation, HR 79  ASSESSMENT AND PLAN: No need for heart catheterization. Patient going home on hospice today.  Principal Problem:   Acute respiratory failure with hypoxia (HCC) Active Problems:   Moderate aortic valve stenosis   Protein-calorie malnutrition, severe   Alcohol abuse   Paroxysmal A-fib (HCC)   Acute kidney injury (HCC)   Chronic systolic CHF (congestive heart failure) (HCC)   Thoracic aortic aneurysm without rupture   Bacterial pneumonia   Sepsis due to undetermined organism (HCC)   Pre-syncope   Troponin I above reference range   Elevated liver function tests   Reactive thrombocytosis    Trace Wirick, FNP-C 12/07/2020 8:43 AM

## 2020-12-07 NOTE — Discharge Summary (Signed)
Physician Discharge Summary  Patient ID: KOLBEE BOGUSZ MRN: [id number] DOB/AGE: 11/23/1940 80 y.o.  Admit date: 12/03/2020 Discharge date: 12/07/2020  Admission Diagnoses:  Discharge Diagnoses:  Principal Problem:   Acute respiratory failure with hypoxia [facility]) Active Problems:   Moderate aortic valve stenosis   Protein-calorie malnutrition, severe   Alcohol abuse   Paroxysmal A-fib (HCC)   Acute kidney injury (HCC)   Chronic systolic CHF (congestive heart failure) (HCC)   Thoracic aortic aneurysm without rupture   Bacterial pneumonia   Sepsis due to undetermined organism (HCC)   Pre-syncope   Troponin I above reference range   Elevated liver function tests   Reactive thrombocytosis   Discharged Condition: fair  Hospital Course:   80 y.o. male with medical history significant for aortic stenosis, chronic systolic CHF EF 20% by 2019 echocardiogram, mitral regurgitation, paroxysmal atrial fibrillation not on anticoagulation due to history of diverticular bleed 2019, thoracic aortic aneurysm, hypertension, severe malnutrition, alcohol abuse, who was prescribed medications but not currently taking any admitted on 12/03/2020 with episode of collapsing / slumping over in a store.  Acute respiratory failure with hypoxemia secondary to pneumonia. Aspiration pneumonia. Sepsis is secondary to pneumonia. Elevated troponin secondary to acute hypoxemia. Condition improving, will complete 5 days antibiotics with Augmentin.  Presyncope secondary to moderate aortic valve stenosis. Moderate aortic valve stenosis. Chronic systolic congestive heart failure.   Paroxysmal atrial fibrillation. Condition relatively stable.   Alcohol abuse. No evidence of withdrawal.  Patient has been evaluated by palliative care, deemed to be poor prognosis.  Referred to hospice.  Patient has been seen by hospice, they will continue to follow as outpatient.  Consults: palliative  Significant Diagnostic  Studies:   Treatments: antibiotics  Discharge Exam: Blood pressure (!) 174/77, pulse 79, temperature 98.1 F (36.7 C), resp. rate 16, height 6' (1.829 m), weight 55.4 kg, SpO2 100 %. General appearance: alert and cooperative Resp: Decreased breathing sounds Cardio: regular rate and rhythm, S1, S2 normal, no murmur, click, rub or gallop GI: soft, non-tender; bowel sounds normal; no masses,  no organomegaly Extremities: extremities normal, atraumatic, no cyanosis or edema  Disposition: Discharge disposition: 50-Hospice/Home       Discharge Instructions     Diet - low sodium heart healthy   Complete by: As directed    Increase activity slowly   Complete by: As directed       Allergies as of 12/07/2020   No Known Allergies      Medication List     STOP taking these medications    feeding supplement Liqd   furosemide 20 MG tablet Commonly known as: LASIX   hydrochlorothiazide 12.5 MG capsule Commonly known as: MICROZIDE   lisinopril 20 MG tablet Commonly known as: ZESTRIL   metoprolol succinate 50 MG 24 hr tablet Commonly known as: TOPROL-XL   nicotine 14 mg/24hr patch Commonly known as: NICODERM CQ - dosed in mg/24 hours   pantoprazole 40 MG tablet Commonly known as: PROTONIX   traMADol 50 MG tablet Commonly known as: Ultram       TAKE these medications    amoxicillin-clavulanate 875-125 MG tablet Commonly known as: AUGMENTIN Take 1 tablet by mouth every 12 (twelve) hours for 2 days.   ProAir HFA 108 (90 Base) MCG/ACT inhaler Generic drug: albuterol Inhale 2 puffs into the lungs every 4 (four) hours as needed for wheezing or shortness of breath.        Follow-up Information     Alwyn Pea, MD Follow  up in 1 week(s).   Specialties: Cardiology, Internal Medicine Contact information: [address] [place] [address] [phone]                32 minutes Signed: Marrion Coy 12/07/2020, 9:55 AM

## 2020-12-08 LAB — CULTURE, BLOOD (ROUTINE X 2)
Culture: NO GROWTH
Culture: NO GROWTH
Special Requests: ADEQUATE
Special Requests: ADEQUATE

## 2021-10-01 DEATH — deceased

## 2023-05-28 IMAGING — CT CT ANGIO CHEST
2 of 6 series · 17 of 46 positions shown · IV contrast (omnipaque)
Comparison: Multiple exams, including 05/17/2017

CLINICAL DATA: Hypoxia.  Elevated D-dimer level.  Weight loss.

EXAM:
CT ANGIOGRAPHY CHEST WITH CONTRAST
TECHNIQUE: Multidetector CT imaging of the chest was performed using the
standard protocol during bolus administration of intravenous
contrast. Multiplanar CT image reconstructions and MIPs were
obtained to evaluate the vascular anatomy.
CONTRAST:  75mL OMNIPAQUE IOHEXOL 350 MG/ML SOLN

[Series 5: thins · axial · 0.69mm/px · z∈[-83,+242]mm · 14 of 445 slices shown]
[im 19/445  lung]
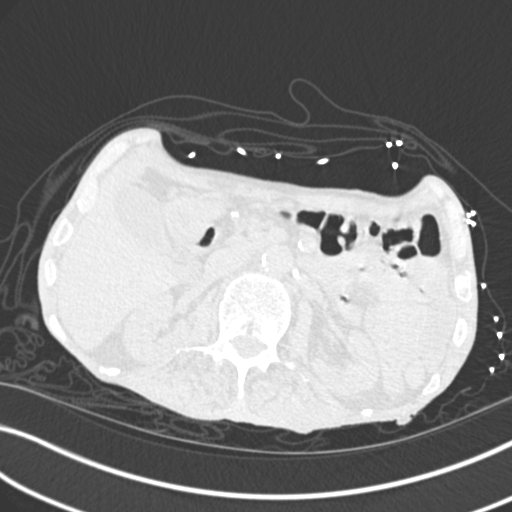
[im 56/445  soft-tissue]
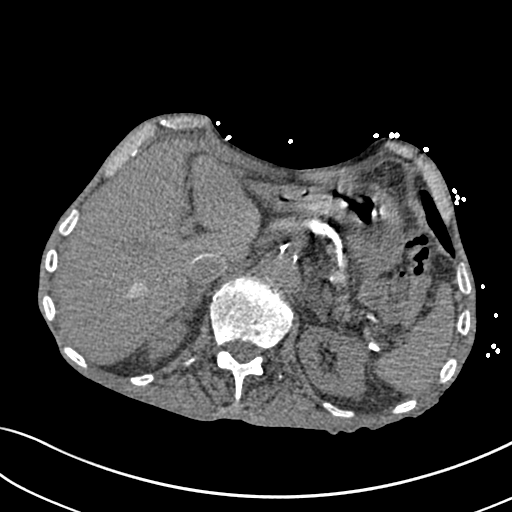
[im 93/445  lung]
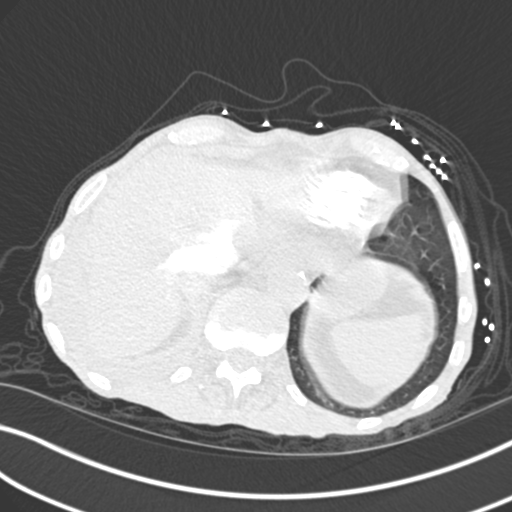
[im 112/445  soft-tissue]
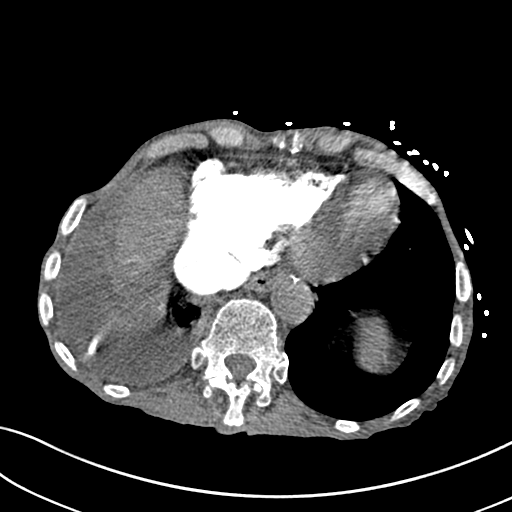
[im 149/445  lung]
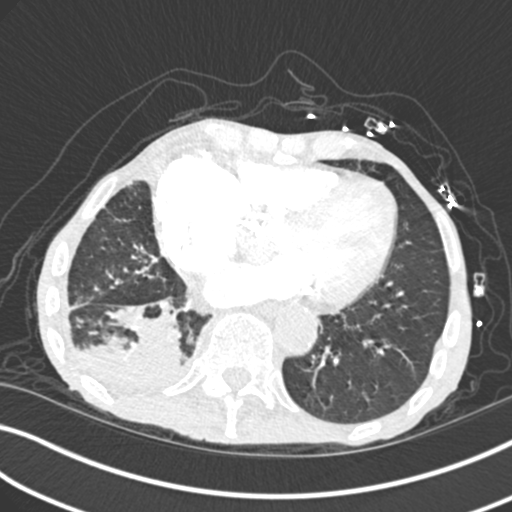
[im 186/445  soft-tissue]
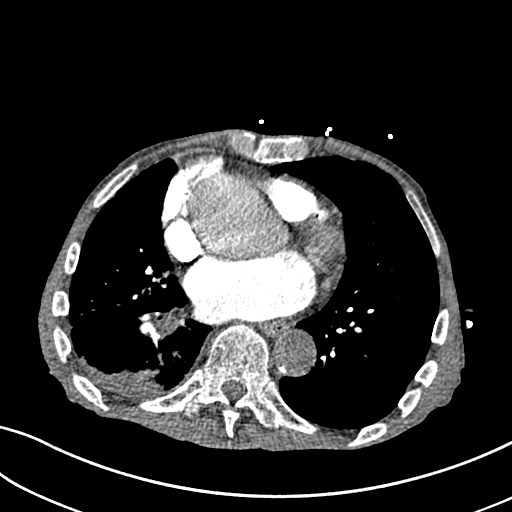
[im 204/445  lung]
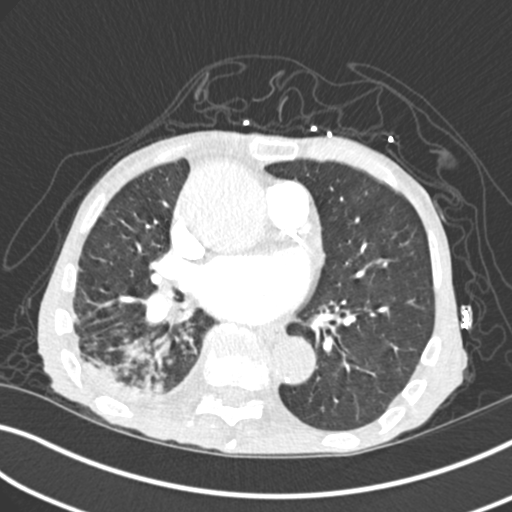
[im 241/445  soft-tissue]
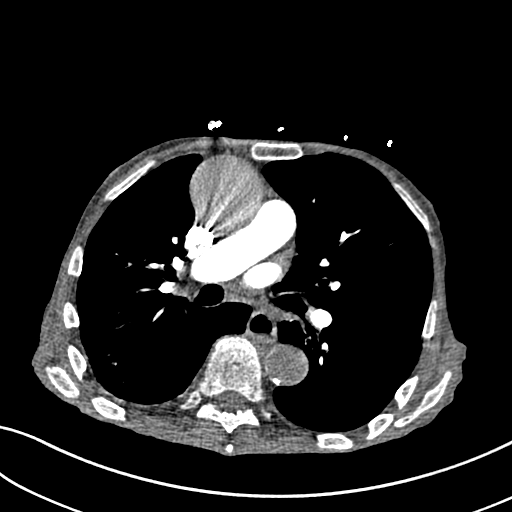
[im 260/445  lung]
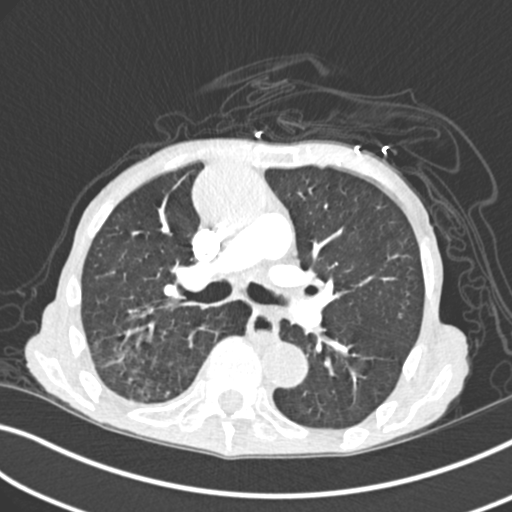
[im 297/445  soft-tissue]
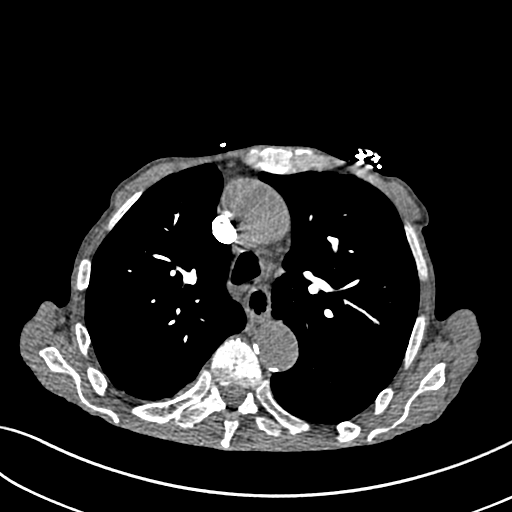
[im 334/445  lung]
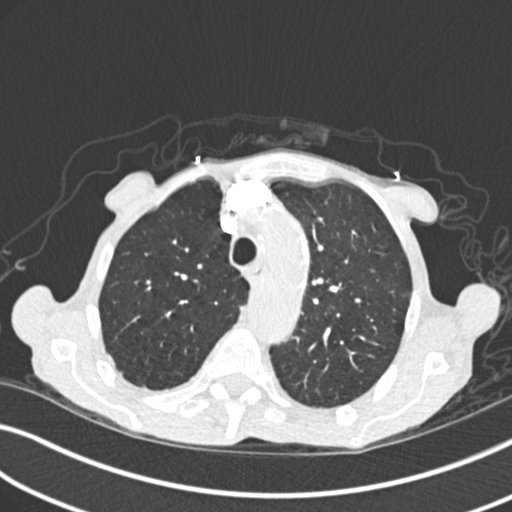
[im 352/445  soft-tissue]
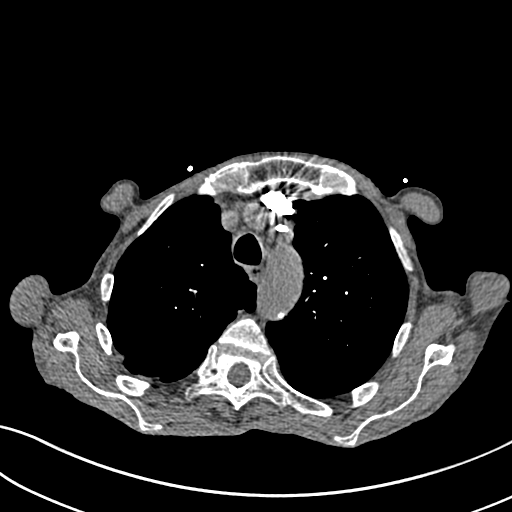
[im 389/445  lung]
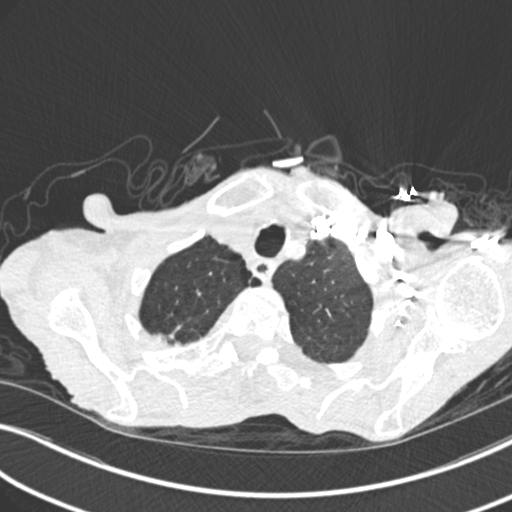
[im 426/445  soft-tissue]
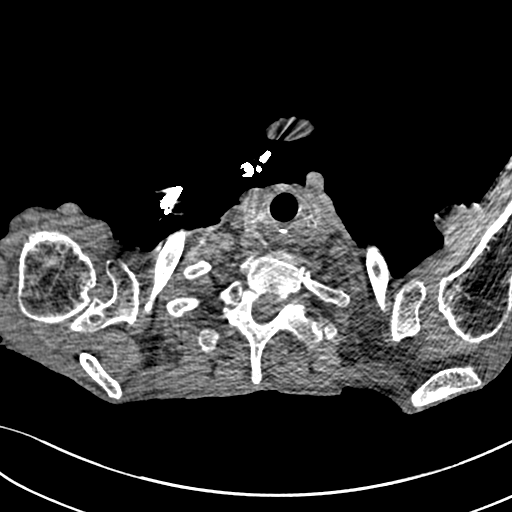

[Series 7: coronal mpr · coronal · 0.65mm/px · 3 of 84 slices shown]
[im 21/84  soft-tissue]
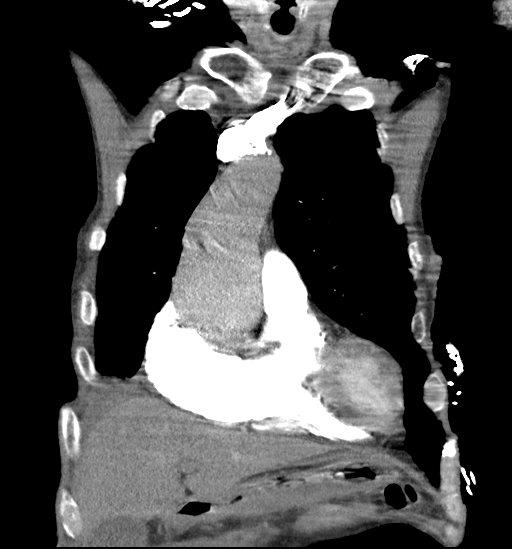
[im 42/84  soft-tissue]
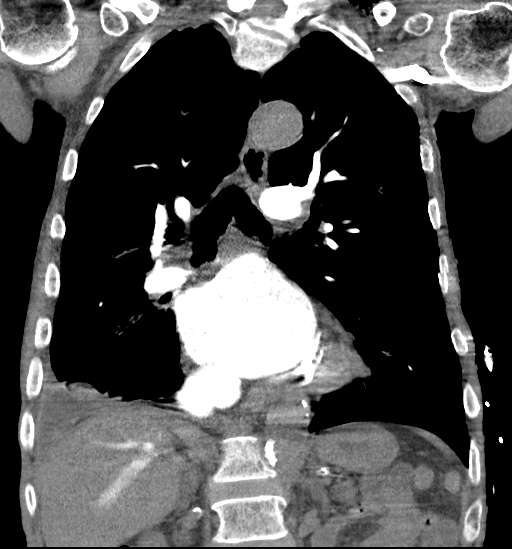
[im 63/84  soft-tissue]
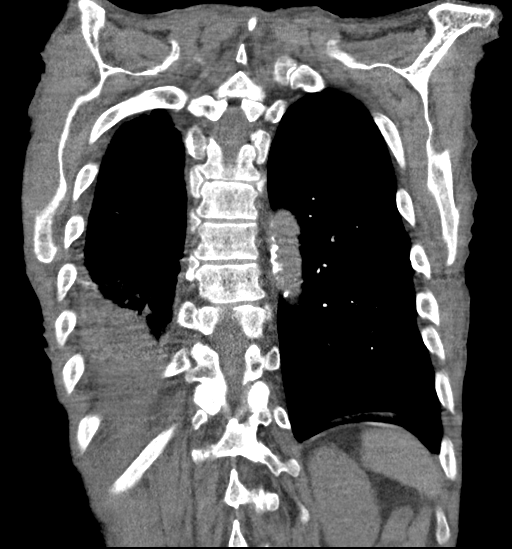

[17 of 46 positions shown; findings below may reference images not displayed]

FINDINGS: Despite efforts by the technologist and patient, motion artifact is
present on today's exam and could not be eliminated. This reduces
exam sensitivity and specificity.

Cardiovascular: No filling defect is identified in the pulmonary
arterial tree to suggest pulmonary embolus.

Ascending thoracic aortic aneurysm 5.4 cm in diameter on image 248
series 5, previously the same on 05/17/2017. Contrast medium was
timed for pulmonary arterial opacification and the systemic
vasculature is poorly opacified.

Coronary, aortic arch, and branch vessel atherosclerotic vascular
disease. Aortic and mitral valve calcifications noted. Moderate
cardiomegaly in particular with right atrial enlargement.

Mediastinum/Nodes: Borderline dilated gas-filled midthoracic
esophagus.

Lungs/Pleura: Centrilobular emphysema. Mild biapical
pleuroparenchymal scarring. Plugging with complete occlusion of the
right lower lobe pulmonary artery with scattered nodularity and
peripheral atelectasis in the right lower lobe distal to the airway
plugging. There is also some airway thickening and bronchiectasis
posteriorly in the right upper lobe. Small right pleural effusion
observed with dense pleural calcification on the right side similar
to the prior exam.

Upper Abdomen: Substantial atherosclerosis of visualized upper
abdominal vessels. This includes the abdominal aorta.

Musculoskeletal: Degenerative glenohumeral arthropathy bilaterally.

Review of the MIP images confirms the above findings.
IMPRESSION: 1. No filling defect is identified in the pulmonary arterial tree to
suggest pulmonary embolus.
2. Ascending thoracic aortic aneurysm 5.4 cm in diameter, not
appreciably changed from 05/17/2017. Ascending thoracic aortic
aneurysm. Recommend semi-annual imaging followup by CTA or MRA and
referral to cardiothoracic surgery if not already obtained. This
recommendation follows 6656
ACCF/AHA/AATS/ACR/ASA/SCA/KELLGREN/ARMANI/KOYA/PALADINO Guidelines for the
Diagnosis and Management of Patients With Thoracic Aortic Disease.
Circulation. 6656; 121: [id number]. Aortic aneurysm NOS (IHK6Q-CKM.6)
3. Aortic Atherosclerosis (IHK6Q-BUA.A). Coronary atherosclerosis
with aortic and mitral valve calcifications along with cardiomegaly
particularly involving the right atrium.
4.  Emphysema ([id number].V).
5. Complete plugging of the right lower lobe bronchus, with
nodularity in the right lower lobe which is likely
inflammatory/infectious, as well as some airspace opacity in the
right lower lobe which may reflect pneumonia or atelectasis.
6. Small right pleural effusion, containing a densely 2.1 cm
calcified structure similar to the prior exam.
7. Mild bronchiectasis and airway thickening posteriorly in the
right upper lobe.
